# Patient Record
Sex: Male | Born: 1935 | Race: White | Hispanic: No | Marital: Married | Smoking: Never smoker
Health system: Southern US, Community
[De-identification: ages and names within clinical notes are randomized; demographics above are authoritative.]

## PROBLEM LIST (undated history)

## (undated) DIAGNOSIS — K5792 Diverticulitis of intestine, part unspecified, without perforation or abscess without bleeding: Secondary | ICD-10-CM

## (undated) DIAGNOSIS — Z972 Presence of dental prosthetic device (complete) (partial): Secondary | ICD-10-CM

## (undated) DIAGNOSIS — D649 Anemia, unspecified: Secondary | ICD-10-CM

## (undated) DIAGNOSIS — Z9289 Personal history of other medical treatment: Secondary | ICD-10-CM

## (undated) DIAGNOSIS — Z87442 Personal history of urinary calculi: Secondary | ICD-10-CM

## (undated) DIAGNOSIS — N429 Disorder of prostate, unspecified: Secondary | ICD-10-CM

## (undated) DIAGNOSIS — H9190 Unspecified hearing loss, unspecified ear: Secondary | ICD-10-CM

## (undated) DIAGNOSIS — N200 Calculus of kidney: Secondary | ICD-10-CM

## (undated) DIAGNOSIS — E119 Type 2 diabetes mellitus without complications: Secondary | ICD-10-CM

## (undated) DIAGNOSIS — R011 Cardiac murmur, unspecified: Secondary | ICD-10-CM

## (undated) DIAGNOSIS — C801 Malignant (primary) neoplasm, unspecified: Secondary | ICD-10-CM

## (undated) DIAGNOSIS — I10 Essential (primary) hypertension: Secondary | ICD-10-CM

## (undated) DIAGNOSIS — R001 Bradycardia, unspecified: Secondary | ICD-10-CM

## (undated) HISTORY — PX: COLONOSCOPY: SHX174

## (undated) HISTORY — PX: HERNIA REPAIR: SHX51

## (undated) HISTORY — PX: HEMORROIDECTOMY: SUR656

## (undated) HISTORY — PX: PILONIDAL CYST EXCISION: SHX744

## (undated) HISTORY — PX: PROSTATE SURGERY: SHX751

## (undated) HISTORY — PX: OTHER SURGICAL HISTORY: SHX169

---

## 2005-06-25 ENCOUNTER — Ambulatory Visit: Payer: Self-pay | Admitting: Unknown Physician Specialty

## 2009-11-12 ENCOUNTER — Encounter: Admission: RE | Admit: 2009-11-12 | Discharge: 2009-11-12 | Payer: Self-pay | Admitting: Unknown Physician Specialty

## 2009-11-14 ENCOUNTER — Encounter: Admission: RE | Admit: 2009-11-14 | Discharge: 2009-11-14 | Payer: Self-pay | Admitting: Unknown Physician Specialty

## 2010-07-31 ENCOUNTER — Ambulatory Visit: Payer: Self-pay | Admitting: Internal Medicine

## 2010-09-03 ENCOUNTER — Ambulatory Visit: Payer: Self-pay | Admitting: Unknown Physician Specialty

## 2010-09-05 ENCOUNTER — Inpatient Hospital Stay: Payer: Self-pay | Admitting: Internal Medicine

## 2011-10-06 DIAGNOSIS — C44310 Basal cell carcinoma of skin of unspecified parts of face: Secondary | ICD-10-CM | POA: Insufficient documentation

## 2012-02-09 ENCOUNTER — Ambulatory Visit: Payer: Self-pay | Admitting: Urology

## 2012-02-09 DIAGNOSIS — D419 Neoplasm of uncertain behavior of unspecified urinary organ: Secondary | ICD-10-CM | POA: Insufficient documentation

## 2012-02-09 DIAGNOSIS — N182 Chronic kidney disease, stage 2 (mild): Secondary | ICD-10-CM | POA: Insufficient documentation

## 2012-02-09 DIAGNOSIS — N200 Calculus of kidney: Secondary | ICD-10-CM | POA: Insufficient documentation

## 2012-05-20 ENCOUNTER — Ambulatory Visit: Payer: Self-pay

## 2012-05-29 ENCOUNTER — Ambulatory Visit: Payer: Self-pay

## 2012-05-31 DIAGNOSIS — N23 Unspecified renal colic: Secondary | ICD-10-CM | POA: Insufficient documentation

## 2012-06-26 ENCOUNTER — Ambulatory Visit: Payer: Self-pay

## 2012-07-27 ENCOUNTER — Ambulatory Visit: Payer: Self-pay

## 2012-12-08 ENCOUNTER — Ambulatory Visit: Payer: Self-pay | Admitting: Urology

## 2013-09-29 ENCOUNTER — Ambulatory Visit: Payer: Self-pay | Admitting: Unknown Physician Specialty

## 2013-09-30 LAB — PATHOLOGY REPORT

## 2013-11-05 ENCOUNTER — Emergency Department: Payer: Self-pay | Admitting: Emergency Medicine

## 2015-02-01 DIAGNOSIS — R339 Retention of urine, unspecified: Secondary | ICD-10-CM | POA: Insufficient documentation

## 2017-02-19 ENCOUNTER — Encounter
Admission: RE | Admit: 2017-02-19 | Discharge: 2017-02-19 | Disposition: A | Discharge status: Home / Self Care | Payer: Medicare Other | Source: Ambulatory Visit | Attending: Cardiology | Admitting: Cardiology

## 2017-02-19 ENCOUNTER — Ambulatory Visit
Admission: RE | Admit: 2017-02-19 | Discharge: 2017-02-19 | Disposition: A | Discharge status: Home / Self Care | Payer: Medicare Other | Source: Ambulatory Visit | Attending: Cardiology | Admitting: Cardiology

## 2017-02-19 DIAGNOSIS — Z01818 Encounter for other preprocedural examination: Secondary | ICD-10-CM | POA: Diagnosis present

## 2017-02-19 DIAGNOSIS — I10 Essential (primary) hypertension: Secondary | ICD-10-CM | POA: Insufficient documentation

## 2017-02-19 DIAGNOSIS — I517 Cardiomegaly: Secondary | ICD-10-CM | POA: Diagnosis not present

## 2017-02-19 DIAGNOSIS — R001 Bradycardia, unspecified: Secondary | ICD-10-CM | POA: Diagnosis not present

## 2017-02-19 DIAGNOSIS — E119 Type 2 diabetes mellitus without complications: Secondary | ICD-10-CM | POA: Diagnosis not present

## 2017-02-19 DIAGNOSIS — Z01812 Encounter for preprocedural laboratory examination: Secondary | ICD-10-CM | POA: Diagnosis present

## 2017-02-19 DIAGNOSIS — Z01810 Encounter for preprocedural cardiovascular examination: Secondary | ICD-10-CM | POA: Diagnosis present

## 2017-02-19 HISTORY — DX: Bradycardia, unspecified: R00.1

## 2017-02-19 HISTORY — DX: Type 2 diabetes mellitus without complications: E11.9

## 2017-02-19 HISTORY — DX: Essential (primary) hypertension: I10

## 2017-02-19 HISTORY — DX: Disorder of prostate, unspecified: N42.9

## 2017-02-19 HISTORY — DX: Calculus of kidney: N20.0

## 2017-02-19 LAB — CBC
HEMATOCRIT: 37.8 % — AB (ref 40.0–52.0)
HEMOGLOBIN: 12.6 g/dL — AB (ref 13.0–18.0)
MCH: 31.2 pg (ref 26.0–34.0)
MCHC: 33.3 g/dL (ref 32.0–36.0)
MCV: 93.8 fL (ref 80.0–100.0)
Platelets: 163 10*3/uL (ref 150–440)
RBC: 4.03 MIL/uL — AB (ref 4.40–5.90)
RDW: 13.3 % (ref 11.5–14.5)
WBC: 5.9 10*3/uL (ref 3.8–10.6)

## 2017-02-19 LAB — PROTIME-INR
INR: 1.06
Prothrombin Time: 13.7 seconds (ref 11.4–15.2)

## 2017-02-19 LAB — BASIC METABOLIC PANEL
ANION GAP: 9 (ref 5–15)
BUN: 21 mg/dL — AB (ref 6–20)
CO2: 25 mmol/L (ref 22–32)
Calcium: 9 mg/dL (ref 8.9–10.3)
Chloride: 105 mmol/L (ref 101–111)
Creatinine, Ser: 0.8 mg/dL (ref 0.61–1.24)
GFR calc Af Amer: >60 mL/min (ref >60)
GLUCOSE: 87 mg/dL (ref 65–99)
POTASSIUM: 4.6 mmol/L (ref 3.5–5.1)
Sodium: 139 mmol/L (ref 135–145)

## 2017-02-19 LAB — SURGICAL PCR SCREEN
MRSA, PCR: NEGATIVE
STAPHYLOCOCCUS AUREUS: NEGATIVE

## 2017-02-19 LAB — APTT: APTT: 31 s (ref 24–36)

## 2017-02-19 NOTE — Patient Instructions (Signed)
Your procedure is scheduled on: 02/25/17 Wed Report to Same Day Surgery 2nd floor medical mall [facility] Entrance-take elevator on left to 2nd floor.  Check in with surgery information desk.) To find out your arrival time please call [phone] between 1PM - 3PM on 02/24/17 Tues  Remember: Instructions that are not followed completely may result in serious medical risk, up to and including death, or upon the discretion of your surgeon and anesthesiologist your surgery may need to be rescheduled.    _x___ 1. Do not eat food after midnight the night before your procedure. You may drink clear liquids up to 2 hours before you are scheduled to arrive at the hospital for your procedure.  Do not drink clear liquids within 2 hours of your scheduled arrival to the hospital.  Clear liquids include  --Water or Apple juice without pulp  --Clear carbohydrate beverage such as ClearFast or Gatorade  --Black Coffee or Clear Tea (No milk, no creamers, do not add anything to                  the coffee or Tea Type 1 and type 2 diabetics should only drink water.  No gum chewing or hard candies.     __x__ 2. No Alcohol for 24 hours before or after surgery.   __x__3. No Smoking for 24 prior to surgery.   ____  4. Bring all medications with you on the day of surgery if instructed.    __x__ 5. Notify your doctor if there is any change in your medical condition     (cold, fever, infections).     Do not wear jewelry, make-up, hairpins, clips or nail polish.  Do not wear lotions, powders, or perfumes. You may wear deodorant.  Do not shave 48 hours prior to surgery. Men may shave face and neck.  Do not bring valuables to the hospital.    [facility] is not responsible for any belongings or valuables.               Contacts, dentures or bridgework may not be worn into surgery.  Leave your suitcase in the car. After surgery it may be brought to your room.  For patients admitted to the hospital,  discharge time is determined by your                       treatment team.   Patients discharged the day of surgery will not be allowed to drive home.  You will need someone to drive you home and stay with you the night of your procedure.    Please read over the following fact sheets that you were given:   [facility] Preparing for Surgery and or MRSA Information   _x___ Take anti-hypertensive listed below, cardiac, seizure, asthma,     anti-reflux and psychiatric medicines. These include:  1. None  2.  3.  4.  5.  6.  ____Fleets enema or Magnesium Citrate as directed.   _x___ Use CHG Soap or sage wipes as directed on instruction sheet   ____ Use inhalers on the day of surgery and bring to hospital day of surgery  ____ Stop Metformin and Janumet 2 days prior to surgery.    ____ Take 1/2 of usual insulin dose the night before surgery and none on the morning     surgery.   _x___ Follow recommendations from Cardiologist, Pulmonologist or PCP regarding  stopping Aspirin, Coumadin, Plavix ,Eliquis, Effient, or Pradaxa, and Pletal.  Stop Aspirin today if OK with MD.  X____Stop Anti-inflammatories such as Advil, Aleve, Ibuprofen, Motrin, Naproxen, Naprosyn, Goodies powders or aspirin products. OK to take Tylenol and                          Celebrex.   _x___ Stop supplements until after surgery.  But may continue Vitamin D, Vitamin B,       and multivitamin.   ____ Bring C-Pap to the hospital.

## 2017-02-24 MED ORDER — CEFAZOLIN SODIUM-DEXTROSE 1-4 GM/50ML-% IV SOLN
1.0000 g | Freq: Once | INTRAVENOUS | Status: AC
  Administered 2017-02-25: 1 g via INTRAVENOUS

## 2017-02-25 ENCOUNTER — Observation Stay: Payer: Medicare Other

## 2017-02-25 ENCOUNTER — Encounter
Admission: RE | Disposition: A | Discharge status: Home / Self Care | Payer: Self-pay | Source: Ambulatory Visit | Attending: Cardiology

## 2017-02-25 ENCOUNTER — Ambulatory Visit: Payer: Medicare Other | Admitting: Anesthesiology

## 2017-02-25 ENCOUNTER — Ambulatory Visit: Payer: Medicare Other

## 2017-02-25 ENCOUNTER — Encounter: Payer: Self-pay | Admitting: *Deleted

## 2017-02-25 ENCOUNTER — Observation Stay
Admission: RE | Admit: 2017-02-25 | Discharge: 2017-02-26 | Disposition: A | Discharge status: Home / Self Care | Payer: Medicare Other | Source: Ambulatory Visit | Attending: Cardiology | Admitting: Cardiology

## 2017-02-25 DIAGNOSIS — R001 Bradycardia, unspecified: Principal | ICD-10-CM | POA: Insufficient documentation

## 2017-02-25 DIAGNOSIS — E119 Type 2 diabetes mellitus without complications: Secondary | ICD-10-CM | POA: Diagnosis not present

## 2017-02-25 DIAGNOSIS — M1990 Unspecified osteoarthritis, unspecified site: Secondary | ICD-10-CM | POA: Diagnosis not present

## 2017-02-25 DIAGNOSIS — I509 Heart failure, unspecified: Secondary | ICD-10-CM | POA: Insufficient documentation

## 2017-02-25 DIAGNOSIS — Z7982 Long term (current) use of aspirin: Secondary | ICD-10-CM | POA: Insufficient documentation

## 2017-02-25 DIAGNOSIS — I495 Sick sinus syndrome: Secondary | ICD-10-CM | POA: Diagnosis present

## 2017-02-25 DIAGNOSIS — I493 Ventricular premature depolarization: Secondary | ICD-10-CM | POA: Insufficient documentation

## 2017-02-25 DIAGNOSIS — Z950 Presence of cardiac pacemaker: Secondary | ICD-10-CM

## 2017-02-25 DIAGNOSIS — I110 Hypertensive heart disease with heart failure: Secondary | ICD-10-CM | POA: Diagnosis not present

## 2017-02-25 HISTORY — DX: Presence of cardiac pacemaker: Z95.0

## 2017-02-25 HISTORY — PX: PACEMAKER INSERTION: SHX728

## 2017-02-25 SURGERY — INSERTION, CARDIAC PACEMAKER
Anesthesia: General | Site: Chest | Wound class: Clean

## 2017-02-25 MED ORDER — FAMOTIDINE 20 MG PO TABS
20.0000 mg | ORAL_TABLET | Freq: Once | ORAL | Status: AC
  Administered 2017-02-25: 20 mg via ORAL

## 2017-02-25 MED ORDER — ACETAMINOPHEN 325 MG PO TABS: 325.0000 mg | ORAL_TABLET | ORAL | Status: DC | PRN

## 2017-02-25 MED ORDER — LIDOCAINE HCL (PF) 2 % IJ SOLN
INTRAMUSCULAR | Status: AC
  Filled 2017-02-25: qty 10

## 2017-02-25 MED ORDER — LISINOPRIL 10 MG PO TABS
30.0000 mg | ORAL_TABLET | Freq: Every day | ORAL | Status: DC
  Administered 2017-02-25 – 2017-02-26 (×2): 30 mg via ORAL
  Filled 2017-02-25 (×2): qty 3

## 2017-02-25 MED ORDER — DEXTROSE 5 % IV SOLN
1000.0000 mg | Freq: Four times a day (QID) | INTRAVENOUS | Status: AC
  Administered 2017-02-25 – 2017-02-26 (×3): 1000 mg via INTRAVENOUS
  Filled 2017-02-25 (×3): qty 10

## 2017-02-25 MED ORDER — PROPOFOL 10 MG/ML IV BOLUS
INTRAVENOUS | Status: DC | PRN
  Administered 2017-02-25: 30 mg via INTRAVENOUS

## 2017-02-25 MED ORDER — ONDANSETRON HCL 4 MG/2ML IJ SOLN
INTRAMUSCULAR | Status: DC | PRN
  Administered 2017-02-25: 4 mg via INTRAVENOUS

## 2017-02-25 MED ORDER — LIDOCAINE 1 % OPTIME INJ - NO CHARGE
INTRAMUSCULAR | Status: DC | PRN
  Administered 2017-02-25: 30 mL

## 2017-02-25 MED ORDER — EPHEDRINE SULFATE 50 MG/ML IJ SOLN
INTRAMUSCULAR | Status: DC | PRN
  Administered 2017-02-25: 10 mg via INTRAVENOUS

## 2017-02-25 MED ORDER — FENTANYL CITRATE (PF) 100 MCG/2ML IJ SOLN
INTRAMUSCULAR | Status: DC | PRN
  Administered 2017-02-25: 25 ug via INTRAVENOUS

## 2017-02-25 MED ORDER — ONDANSETRON HCL 4 MG/2ML IJ SOLN
INTRAMUSCULAR | Status: AC
  Filled 2017-02-25: qty 2

## 2017-02-25 MED ORDER — GENTAMICIN SULFATE 40 MG/ML IJ SOLN
INTRAMUSCULAR | Status: AC
  Filled 2017-02-25: qty 2

## 2017-02-25 MED ORDER — SODIUM CHLORIDE 0.9 % IR SOLN: Freq: Once | Status: DC

## 2017-02-25 MED ORDER — PROPOFOL 500 MG/50ML IV EMUL
INTRAVENOUS | Status: AC
  Filled 2017-02-25: qty 50

## 2017-02-25 MED ORDER — SODIUM CHLORIDE 0.9 % IR SOLN
Status: DC | PRN
  Administered 2017-02-25: 200 mL

## 2017-02-25 MED ORDER — PROPOFOL 500 MG/50ML IV EMUL
INTRAVENOUS | Status: DC | PRN
  Administered 2017-02-25: 125 ug/kg/min via INTRAVENOUS

## 2017-02-25 MED ORDER — PROPOFOL 10 MG/ML IV BOLUS
INTRAVENOUS | Status: AC
  Filled 2017-02-25: qty 20

## 2017-02-25 MED ORDER — CEFAZOLIN SODIUM-DEXTROSE 1-4 GM/50ML-% IV SOLN
1.0000 g | Freq: Four times a day (QID) | INTRAVENOUS | Status: DC
  Filled 2017-02-25 (×2): qty 50

## 2017-02-25 MED ORDER — OFF THE BEAT BOOK: Freq: Once | Status: DC

## 2017-02-25 MED ORDER — LACTATED RINGERS IV SOLN
INTRAVENOUS | Status: DC
  Administered 2017-02-25: 12:00:00 via INTRAVENOUS

## 2017-02-25 MED ORDER — GLYCOPYRROLATE 0.2 MG/ML IJ SOLN
INTRAMUSCULAR | Status: DC | PRN
  Administered 2017-02-25: 0.2 mg via INTRAVENOUS

## 2017-02-25 MED ORDER — FENTANYL CITRATE (PF) 100 MCG/2ML IJ SOLN
INTRAMUSCULAR | Status: AC
  Filled 2017-02-25: qty 2

## 2017-02-25 MED ORDER — ONDANSETRON HCL 4 MG/2ML IJ SOLN: 4.0000 mg | Freq: Four times a day (QID) | INTRAMUSCULAR | Status: DC | PRN

## 2017-02-25 SURGICAL SUPPLY — 40 items
BAG DECANTER FOR FLEXI CONT (MISCELLANEOUS) ×3 IMPLANT
BRUSH SCRUB EZ  4% CHG (MISCELLANEOUS) ×2
BRUSH SCRUB EZ 4% CHG (MISCELLANEOUS) ×1 IMPLANT
CABLE SURG 12 DISP A/V CHANNEL (MISCELLANEOUS) ×3 IMPLANT
CANISTER SUCT 1200ML W/VALVE (MISCELLANEOUS) ×3 IMPLANT
CHLORAPREP W/TINT 26ML (MISCELLANEOUS) ×3 IMPLANT
CLOSURE WOUND 1/2 X4 (GAUZE/BANDAGES/DRESSINGS) ×1
COVER LIGHT HANDLE STERIS (MISCELLANEOUS) ×6 IMPLANT
COVER MAYO STAND STRL (DRAPES) ×3 IMPLANT
DRAPE C-ARM XRAY 36X54 (DRAPES) ×3 IMPLANT
DRSG TEGADERM 4X4.75 (GAUZE/BANDAGES/DRESSINGS) ×3 IMPLANT
DRSG TELFA 4X3 1S NADH ST (GAUZE/BANDAGES/DRESSINGS) ×3 IMPLANT
ELECT REM PT RETURN 9FT ADLT (ELECTROSURGICAL) ×3
ELECTRODE REM PT RTRN 9FT ADLT (ELECTROSURGICAL) ×1 IMPLANT
GLOVE BIO SURGEON STRL SZ7.5 (GLOVE) ×3 IMPLANT
GLOVE BIO SURGEON STRL SZ8 (GLOVE) ×3 IMPLANT
GOWN STRL REUS W/ TWL LRG LVL3 (GOWN DISPOSABLE) ×1 IMPLANT
GOWN STRL REUS W/ TWL XL LVL3 (GOWN DISPOSABLE) ×1 IMPLANT
GOWN STRL REUS W/TWL LRG LVL3 (GOWN DISPOSABLE) ×2
GOWN STRL REUS W/TWL XL LVL3 (GOWN DISPOSABLE) ×2
IMMOBILIZER SHDR MD LX WHT (SOFTGOODS) IMPLANT
IMMOBILIZER SHDR XL LX WHT (SOFTGOODS) ×3 IMPLANT
INTRO PACEMAKR LEAD 9FR 13CM (INTRODUCER) ×3
INTRO PACEMKR SHEATH II 7FR (MISCELLANEOUS) ×3
INTRODUCER PACEMKR LD 9FR 13CM (INTRODUCER) ×1 IMPLANT
INTRODUCER PACEMKR SHTH II 7FR (MISCELLANEOUS) ×1 IMPLANT
IPG PACE AZUR XT DR MRI W1DR01 (Pacemaker) ×1 IMPLANT
IV NS 500ML (IV SOLUTION) ×2
IV NS 500ML BAXH (IV SOLUTION) ×1 IMPLANT
KIT RM TURNOVER STRD PROC AR (KITS) ×3 IMPLANT
LABEL OR SOLS (LABEL) ×3 IMPLANT
LEAD CAPSURE NOVUS 5076-52CM (Lead) ×3 IMPLANT
LEAD CAPSURE NOVUS 5076-58CM (Lead) ×3 IMPLANT
MARKER SKIN DUAL TIP RULER LAB (MISCELLANEOUS) ×3 IMPLANT
PACE AZURE XT DR MRI W1DR01 (Pacemaker) ×3 IMPLANT
PACK PACE INSERTION (MISCELLANEOUS) ×3 IMPLANT
PAD ONESTEP ZOLL R SERIES ADT (MISCELLANEOUS) ×3 IMPLANT
SAFESHEATHLL ×5 IMPLANT
STRIP CLOSURE SKIN 1/2X4 (GAUZE/BANDAGES/DRESSINGS) ×2 IMPLANT
SUT SILK 0 SH 30 (SUTURE) ×9 IMPLANT

## 2017-02-25 NOTE — Progress Notes (Signed)
Talked to Dr. Marcille Blanco about patient's pacemaker site  saturated with blood and was change twice this shift, MD at bedside order to change dressing with gauze and silk tape. RN will also page cardiology. RN will continue to monitor.

## 2017-02-25 NOTE — Anesthesia Post-op Follow-up Note (Signed)
Anesthesia QCDR form completed.        

## 2017-02-25 NOTE — Progress Notes (Signed)
Talked to Dr. Nehemiah Massed and notified about the pacemaker site being saturated with blood and change twice this shift, notify about the dressing change. No other concern at the moment. RN will continue to monitor.

## 2017-02-25 NOTE — Anesthesia Preprocedure Evaluation (Signed)
Anesthesia Evaluation  Patient identified by MRN, date of birth, ID band Patient awake    Reviewed: Allergy & Precautions, H&P , NPO status , Patient's Chart, lab work & pertinent test results  History of Anesthesia Complications Negative for: history of anesthetic complications  Airway Mallampati: III  TM Distance: >3 FB Neck ROM: full    Dental  (+) Chipped, Missing, Poor Dentition   Pulmonary neg pulmonary ROS, neg shortness of breath,           Cardiovascular Exercise Tolerance: Good hypertension, (-) angina(-) Past MI + dysrhythmias      Neuro/Psych negative neurological ROS  negative psych ROS   GI/Hepatic negative GI ROS, Neg liver ROS,   Endo/Other  diabetes, Type 2  Renal/GU Renal disease  negative genitourinary   Musculoskeletal   Abdominal   Peds  Hematology negative hematology ROS (+)   Anesthesia Other Findings Signs and symptoms suggestive of sleep apnea   Past Medical History: No date: Bradycardia No date: Diabetes mellitus without complication (HCC)     Comment:  diet controlled No date: Hypertension No date: Kidney stones No date: Prostate disorder  Past Surgical History: No date: HERNIA REPAIR No date: PILONIDAL CYST EXCISION No date: PROSTATE SURGERY  BMI    Body Mass Index:  25.07 kg/m      Reproductive/Obstetrics negative OB ROS                             Anesthesia Physical Anesthesia Plan  ASA: III  Anesthesia Plan: General   Post-op Pain Management:    Induction: Intravenous  PONV Risk Score and Plan: Propofol infusion  Airway Management Planned: Natural Airway and Nasal Cannula  Additional Equipment:   Intra-op Plan:   Post-operative Plan:   Informed Consent: I have reviewed the patients History and Physical, chart, labs and discussed the procedure including the risks, benefits and alternatives for the proposed anesthesia with the  patient or authorized representative who has indicated his/her understanding and acceptance.   Dental Advisory Given  Plan Discussed with: Anesthesiologist, CRNA and Surgeon  Anesthesia Plan Comments: (Patient consented for risks of anesthesia including but not limited to:  - adverse reactions to medications - risk of intubation if required - damage to teeth, lips or other oral mucosa - sore throat or hoarseness - Damage to heart, brain, lungs or loss of life  Patient voiced understanding.)        Anesthesia Quick Evaluation

## 2017-02-25 NOTE — Transfer of Care (Signed)
Immediate Anesthesia Transfer of Care Note  Patient: Melvin Ferguson  Procedure(s) Performed: INSERTION PACEMAKER (N/A Chest)  Patient Location: PACU  Anesthesia Type:General  Level of Consciousness: awake, alert , oriented and patient cooperative  Airway & Oxygen Therapy: Patient Spontanous Breathing and Patient connected to nasal cannula oxygen  Post-op Assessment: Report given to RN and Post -op Vital signs reviewed and stable  Post vital signs: Reviewed and stable  Last Vitals:  Vitals:   02/25/17 1352 02/25/17 1355  BP: 140/67   Pulse: 60 (!) 58  Resp: 14 16  Temp: (!) 36.2 C   SpO2: 95% 96%    Last Pain:  Vitals:   02/25/17 1352  TempSrc: Temporal  PainSc: 0-No pain         Complications: No apparent anesthesia complications

## 2017-02-25 NOTE — Interval H&P Note (Signed)
History and Physical Interval Note:  02/25/2017 12:26 PM  Melvin Ferguson  has presented today for surgery, with the diagnosis of BRADYCARDIA  The various methods of treatment have been discussed with the patient and family. After consideration of risks, benefits and other options for treatment, the patient has consented to  Procedure(s): INSERTION PACEMAKER (N/A) as a surgical intervention .  The patient's history has been reviewed, patient examined, no change in status, stable for surgery.  I have reviewed the patient's chart and labs.  Questions were answered to the patient's satisfaction.     Javontay Vandam [employer]

## 2017-02-25 NOTE — Plan of Care (Signed)
Problem: Safety: Goal: Ability to remain free from injury will improve Outcome: Progressing  Patient's VSS throughout the shift. Patient denies pain. Pacemaker site had episode of dressing being saturated, change with pressure dressing. Patient rested well. RN will continue to monitor.

## 2017-02-25 NOTE — Op Note (Signed)
[facility]   02/25/2017                     1:48 PM  PATIENT:  Melvin Ferguson    PRE-OPERATIVE DIAGNOSIS:  BRADYCARDIA  POST-OPERATIVE DIAGNOSIS:  Same  PROCEDURE:  INSERTION PACEMAKER  SURGEON:  Isaias Cowman, MD    ANESTHESIA:     PREOPERATIVE INDICATIONS:  [name] is a  83 y.o. male with a diagnosis of BRADYCARDIA who failed conservative measures and elected for surgical management.    The risks benefits and alternatives were discussed with the patient preoperatively including but not limited to the risks of infection, bleeding, cardiopulmonary complications, the need for revision surgery, among others, and the patient was willing to proceed.   OPERATIVE PROCEDURE: The patient was brought to the operating room in fasting state.  The left pectoral region was prepped and draped in the usual sterile manner.  Anesthesia was obtained 1% lidocaine locally.  A 6 cm incision was performed in the left pectoral region.  Pacemaker pocket was generated by electrocautery and blunt dissection.  Access was obtained to the left subclavian vein by fine-needle aspiration.  MRI compatible leads were positioned into the right ventricular apical septum ( Medtronic [id number] ) and right atrial appendage ( Medtronic [id number] ) under fluoroscopic guidance.  After proper thresholds were obtained the leads were sutured in place.  Leads were connected to a MRI compatible dual-chamber rate responsive pacemaker generator ( Medtronic [id number] ).  The pacemaker pocket was irrigated with gentamicin solution.  The pacemaker generator was positioned into the pocket and the pocket was closed with 2-0 and 4-0 Vicryl, respectively.  Steri-Strips and a pressure dressing were applied.  Postprocedural interrogation revealed appropriate sensing and pacing thresholds..  There were no periprocedural complications.

## 2017-02-25 NOTE — H&P (Signed)
<6>29299-5<7>Encounter Details<6>46240-8<7>Social History<6>29762-2<7>Last Filed Vital Signs<6>8716-3<7>Progress Notes<6>10164-2<7>Plan of Treatment<6>18776-5<7>Procedures<6>47519-4<7>Lab Results<6>30954-2<7>Visit Diagnoses<6>51848-0<7>/"> Jump to Section ? Document InformationEncounter DetailsLab ResultsLast Filed Vital SignsPatient DemographicsPlan of TreatmentProceduresProgress NotesReason for VisitSocial HistoryVisit Diagnoses Melvin Ferguson, generated on Oct. 31, 2018 Printout Information  Document Contents Office Visit Document Received Date Oct. 31, 2018 [place]   Patient Demographics - 83 y.o. Male, born July 13, 1931   Patient Address Communication Language Race / Ethnicity  [address] [place], [place] [address] [phone] [facility]) [phone] (Mobile) [email or web address] English (Preferred) White / Not Hispanic or Latino  Reason for Visit    Reason Comments  Follow-up feeling about the same as before    Encounter Details    Date Type Department Care Team Description  02/13/2017 Office Visit [facility]  [place], [place] [address]  [phone]  Sydnee Levans, [place] [address] [place]  [place], [place] [address]  [phone]  [phone] (Fax)  Preop testing (Primary Dx);  Bradycardia;  Essential hypertension   Social History - as of this encounter   Tobacco Use Types Packs/Day Years Used Date  Never Smoker      Smokeless Tobacco: Never Used      Alcohol Use Drinks/Week oz/Week Comments  No      Sex Assigned at [employer] Date Recorded  Not on file    Job Start Date Occupation Industry  Not on file Not on file Not on file   Travel History Travel Start Travel End  No recent travel history available.     Last Filed Vital Signs - in this encounter   Vital Sign Reading Time  Taken  Blood Pressure 118/62 02/13/2017 2:15 PM EDT  Pulse 49 02/13/2017 2:15 PM EDT  Temperature - -  Respiratory Rate - -  Oxygen Saturation 98% 02/13/2017 2:15 PM EDT  Inhaled Oxygen Concentration - -  Weight 87.1 kg (192 lb) 02/13/2017 2:15 PM EDT  Height 182.9 cm (6') 02/13/2017 2:15 PM EDT  Body Mass Index 26.04 02/13/2017 2:15 PM EDT   Progress Notes - in this encounter   Sydnee Levans, MD - 02/13/2017 2:00 PM EDT Formatting of this note might be different from the original.   Chief Complaint: Chief Complaint  Patient presents with  . Follow-up  feeling about the same as before  Date of Service: 02/13/2017 Date of Birth: Sep 18, 1931 PCP: Angelena Form, MD  History of Present Illness: Mr. [name] is a 83 y.o.male patient who presents for evaluation. Was recently noted to have an abnormal electrocardiogram showing sinus bradycardia. He also has a history of hypertension. He has some generalized fatigue but denies syncope or presyncope. EKG has shown sinus bradycardia with intermittent episodes of PVCs with fusion beats as well as ventricular bigeminy. He has PVCs with fusion beats. He denies any over-the-counter medications or illicit drug use. He gets lightheaded but has never had syncope. He does have some daytime somnolence. Echocardiogram showed preserved LV function. Holter monitor revealed evidence of an average heart rate of 55 with a minimum rate of 22 and a maximum rate of 93. He had frequent pauses of greater than 2 seconds longest of which was 2.9 seconds. Many of the pauses were at night during sleeping hours however he has some during daytime hours with near syncope. Overnight pulse ox is pending however patient will need dual-chamber permanent pacemaker due to symptomatic bradycardia and he is on no AV nodal meds.  Past Medical and Surgical History  Past Medical History Past Medical History:  Diagnosis Date  . Anemia, unspecified  . Arthritis  .  Diverticulosis  . History of chicken pox  . Hyperglycemia, unspecified  . Hypertension  . Kidney stones  Follows with Dr Jacqlyn Larsen  . Lung nodule   Past Surgical History He has a past surgical history that includes Colonoscopy (12/27/1997); Colonoscopy (06/15/1998); Colonoscopy (10/21/2000); Colonoscopy (06/25/2005); Colonoscopy (09/03/2010); Colonoscopy (09/29/2013); TURP vaporization; Mohs surgery; and Hernia repair.   Medications and Allergies  Current Medications  Current Outpatient Medications  Medication Sig Dispense Refill  . aspirin 81 MG EC tablet Take 81 mg by mouth once daily.  . blood glucose diagnostic (ONETOUCH ULTRA TEST) test strip Use once daily. Test 1 time daily 100 each 5  . calcium citrate-vitamin D3 (CITRACAL+D) 315-200 mg-unit tablet Take 2 tablets by mouth 2 (two) times daily with meals.  Marland Kitchen lisinopril (PRINIVIL,ZESTRIL) 30 MG tablet Take 1 tablet (30 mg total) by mouth once daily. 30 tablet 5  . magnesium 250 mg Tab Take 1 tablet by mouth once daily.  . multivitamin tablet Take 1 tablet by mouth once daily.   No current facility-administered medications for this visit.   Allergies: Patient has no known allergies.  Social and Family History  Social History reports that he has never smoked. He has never used smokeless tobacco. He reports that he does not drink alcohol or use drugs.  Family History Family History  Problem Relation Age of Onset  . Colon cancer Mother  . Parkinsonism Father   Review of Systems  Review of Systems  Constitutional: Positive for malaise/fatigue. Negative for chills, diaphoresis, fever and weight loss.  HENT: Negative for congestion, ear discharge, hearing loss and tinnitus.  Eyes: Negative for blurred vision.  Respiratory: Negative for cough, hemoptysis, sputum production, shortness of breath and wheezing.  Cardiovascular: Negative for chest pain, palpitations, orthopnea, claudication, leg swelling and PND.  Gastrointestinal:  Negative for abdominal pain, blood in stool, constipation, diarrhea, heartburn, melena, nausea and vomiting.  Genitourinary: Negative for dysuria, frequency, hematuria and urgency.  Musculoskeletal: Negative for back pain, falls, joint pain and myalgias.  Skin: Negative for itching and rash.  Neurological: Positive for dizziness. Negative for tingling, focal weakness, loss of consciousness, weakness and headaches.  Endo/Heme/Allergies: Negative for polydipsia. Does not bruise/bleed easily.  Psychiatric/Behavioral: Negative for depression, memory loss and substance abuse. The patient is not nervous/anxious.   Physical Examination   Vitals:BP 118/62 (BP Location: Left upper arm, Patient Position: Sitting, BP Cuff Size: Adult)  Pulse (!) 49  Ht 182.9 cm (6')  Wt 87.1 kg (192 lb)  SpO2 98%  BMI 26.04 kg/m  Ht:182.9 cm (6') Wt:87.1 kg (192 lb) TGG:YIRS surface area is 2.1 meters squared. Body mass index is 26.04 kg/m.  Wt Readings from Last 3 Encounters:  02/13/17 87.1 kg (192 lb)  01/29/17 86.6 kg (191 lb)  01/02/17 84.8 kg (187 lb)   BP Readings from Last 3 Encounters:  02/13/17 118/62  01/29/17 118/68  01/02/17 140/70   General appearance appears in no acute distress  Head Mouth and Eye exam Normocephalic, without obvious abnormality, atraumatic Dentition is good Eyes appear anicteric   Neck exam Thyroid: normal  Nodes: no obvious adenopathy  LUNGS Breath Sounds: Normal Percussion: Normal  CARDIOVASCULAR JVP CV wave: no HJR: no Elevation at 90 degrees: None Carotid Pulse: normal pulsation bilaterally Bruit: None Apex: apical impulse normal  Auscultation Rhythm: sinus bradycardia, rate=49 S1: normal S2: normal Clicks:  no Rub: no Murmurs: no murmurs  Gallop: None ABDOMEN Liver enlargement: no Pulsatile aorta: no Ascites: no Bruits: no  EXTREMITIES Clubbing: no Edema: trace to 1+ bilateral pedal edema Pulses: peripheral pulses  symmetrical Femoral Bruits: no Amputation: no SKIN Rash: no Cyanosis: no Embolic phemonenon: no Bruising: no NEURO Alert and Oriented to person, place and time: yes Non focal: yes  PSYCH: Pt appears to have normal affect  LABS REVIEWED Last 3 CBC results: Lab Results  Component Value Date  WBC 5.3 06/17/2016  WBC 5.5 06/13/2015  WBC 5.2 12/05/2014   Lab Results  Component Value Date  HGB 12.6 (L) 06/17/2016  HGB 12.7 (L) 06/13/2015  HGB 12.3 (L) 12/05/2014   Lab Results  Component Value Date  HCT 37.5 (L) 06/17/2016  HCT 38.5 (L) 06/13/2015  HCT 37.4 (L) 12/05/2014   Lab Results  Component Value Date  PLT 156 06/17/2016  PLT 174 06/13/2015  PLT 156 12/05/2014   Lab Results  Component Value Date  CREATININE 0.9 12/17/2016  BUN 22 12/17/2016  NA 142 12/17/2016  K 4.7 12/17/2016  CL 108 12/17/2016  CO2 28.5 12/17/2016   Lab Results  Component Value Date  HGBA1C 6.0 (H) 12/17/2016   Lab Results  Component Value Date  HDL 50.8 06/17/2016  HDL 47.4 06/13/2015  HDL 40.1 05/30/2014   Lab Results  Component Value Date  LDLCALC 100 06/17/2016  LDLCALC 107 06/13/2015  LDLCALC 121 05/30/2014   Lab Results  Component Value Date  TRIG 103 06/17/2016  TRIG 101 06/13/2015  TRIG 120 05/30/2014   Lab Results  Component Value Date  ALT 24 12/17/2016  AST 23 12/17/2016  ALKPHOS 57 12/17/2016   No results found for: TSH  Diagnostic Studies Reviewed:  EKG EKG demonstrated nonspecific ST and T waves changes, sinus bradycardia, frequent PVC's noted.  Assessment and Plan   83 y.o. male with  ICD-10-CM ICD-9-CM  1. Bradycardia-etiology of bradycardia is unclear. May be related to his premature ventricular contractions. Holter showed some pausing especially at night. His bradycardia that he is noted to have appears to be largely due to PVCs but also does have pauses of up to 2.9 seconds. Will need dual-chamber permanent pacemaker. Risk and benefits of this  procedure were explained to the patient and his wife. No driving until device is placed. Further recommendations after pacemaker. R00.1 427.89     2. Essential hypertension-continue with lisinopril. Avoid rate related medications. DASH diet recommended. I10 401.9  3. SOB (shortness of breath)-will continue to follow. Echo showed no valvular abnormalities and normal LV function. R06.02 786.05   Return in about 3 weeks (around 03/06/2017).  These notes generated with voice recognition software. I apologize for typographical errors.  Sydnee Levans, MD      Plan of Treatment - as of this encounter   Upcoming Encounters Upcoming Encounters  Date Type Specialty Care Team Description  06/19/2017 Ancillary Orders Lab Angelena Form, MD  [place], [place] [address]  [phone]  [phone] (Fax)    06/26/2017 Office Visit  Angelena Form, MD  [place], [place] [address]  [phone]  [phone] (Fax)     Procedures - in this encounter   Procedure Name Priority Date/Time Associated Diagnosis Comments  CBC W/AUTO DIFFERENTIAL (5 PART DIFF) -[facility], [facility] Routine 02/13/2017 2:53 PM EDT Preop testing  Results for this procedure are in the results section.   BASIC METABOLIC PANEL (BMP) - [facility] A[facility], Jefm Bryant  Routine 02/13/2017 2:53 PM EDT Preop testing  Results for this procedure are in the results section.    Lab Results - in this encounter   Table of Contents for Lab Results CBC w/auto Differential (5 Part) (02/13/2017 2:53 PM EDT) Basic Metabolic Panel (BMP) (03/50/0938 2:53 PM EDT)    CBC w/auto Differential (5 Part) (02/13/2017 2:53 PM EDT) CBC w/auto Differential (5 Part) (02/13/2017 2:53 PM EDT)  Component Value Ref Range Performed At Pathologist Signature  WBC (White Blood Cell Count) 5.1 4.1 - 10.2 10^3/uL [facility] - [facility]   RBC (Red Blood Cell Count) 3.70 (L)  4.69 - 6.13 10^6/uL [facility]   Hemoglobin 11.8 (L) 14.1 - 18.1 gm/dL [facility]   Hematocrit 36.3 (L) 40.0 - 52.0 % [facility]   MCV (Mean Corpuscular Volume) 98.1 80.0 - 100.0 fl [facility]   MCH (Mean Corpuscular Hemoglobin) 31.9 (H) 27.0 - 31.2 pg [facility]   MCHC (Mean Corpuscular Hemoglobin Concentration) 32.5 32.0 - 36.0 gm/dL [facility]   Platelet Count 142 (L) 150 - 450 10^3/uL [place] [facility]   RDW-CV (Red Cell Distribution Width) 13.1 11.6 - 14.8 % [facility]   MPV (Mean Platelet Volume) 9.6 9.4 - 12.4 fl [facility] - [facility]   Neutrophils 2.73 1.50 - 7.80 10^3/uL [place] - [facility]   Lymphocytes 1.37 1.00 - 3.60 10^3/uL [place] [facility] - [facility]   Monocytes 0.82 0.00 - 1.50 10^3/uL [facility]   Eosinophils 0.15 0.00 - 0.55 10^3/uL [facility]   Basophils 0.04 0.00 - 0.09 10^3/uL [facility]   Neutrophil % 53.3 32.0 - 70.0 % [facility]   Lymphocyte % 26.8 10.0 - 50.0 % [facility]   Monocyte % 16.0 (H) 4.0 - 13.0 % [facility]   Eosinophil % 2.9 1.0 - 5.0 % [facility]   Basophil% 0.8 0.0 - 2.0 % [facility]   Immature Granulocyte % 0.2 <=0.7 % [facility]   Immature Granulocyte Count 0.01 <=0.06 10^3/L [facility]    CBC w/auto Differential (5 Part) (02/13/2017 2:53 PM EDT)  Specimen  Blood   CBC w/auto Differential (5 Part) (02/13/2017 2:53 PM EDT)  Performing Organization Address City/State/Zipcode Phone Number  [facility] - [facility]  [place], [place] [address]    Back to top of Lab Results   Basic Metabolic Panel (BMP) (96/78/9381 2:53 PM EDT) Basic Metabolic Panel (BMP) (01/75/1025 2:53 PM EDT)  Component Value Ref Range  Performed At Pathologist Signature  Glucose 89 70 - 110 mg/dL [facility]   Sodium 141 136 - 145 mmol/L [facility]   Potassium 4.8 3.6 - 5.1 mmol/L [facility]   Chloride 107 97 - 109 mmol/L [facility]   Carbon Dioxide (CO2) 29.0 22.0 - 32.0 mmol/L [facility]   Calcium 9.1 8.7 - 10.3 mg/dL [facility]   Urea Nitrogen (BUN) 19 7 - 25 mg/dL [facility]   Creatinine 0.8 0.7 - 1.3 mg/dL [facility]   Glomerular Filtration  Rate (eGFR), MDRD Estimate 92 >60 mL/min/1.73sq m [facility]   BUN/Crea Ratio 23.8 (H) 6.0 - 20.0 [facility]   Anion Gap w/K 9.8 6.0 - 16.0 [place] - [facility]    Basic Metabolic Panel (BMP) (52/58/9483 2:53 PM EDT)  Specimen  Blood   Basic Metabolic Panel (BMP) (47/58/3074 2:53 PM EDT)  Performing Organization Address City/State/Zipcode Phone Number  [place]  [address], [place] [address]    Back to top of Lab Results   Visit Diagnoses - in this encounter   Diagnosis  Preop testing - Primary  Unspecified pre-operative examination   Bradycardia  Other specified cardiac dysrhythmias   Essential hypertension    Images Document Information   Primary Care Provider Angelena Form MD (Apr. 25, 2015 - Present) [phone] (Work) [phone] (Fax) [address] [place], [place] [address]  Document Coverage Dates Oct. 19, 2018  [facility] [address] [place], [place] [address]   Encounter Providers Sydnee Levans MD (Attending) [phone] (Work) [phone] (Fax) [place], [place] [address]   Encounter Date Oct. 19, 2018   Show All Sections

## 2017-02-25 NOTE — Anesthesia Postprocedure Evaluation (Signed)
Anesthesia Post Note  Patient: Melvin Ferguson  Procedure(s) Performed: INSERTION PACEMAKER (N/A Chest)  Patient location during evaluation: PACU Anesthesia Type: General Level of consciousness: awake and alert Pain management: pain level controlled Vital Signs Assessment: post-procedure vital signs reviewed and stable Respiratory status: spontaneous breathing, nonlabored ventilation, respiratory function stable and patient connected to nasal cannula oxygen Cardiovascular status: blood pressure returned to baseline and stable Postop Assessment: no apparent nausea or vomiting Anesthetic complications: no     Last Vitals:  Vitals:   02/25/17 1422 02/25/17 1435  BP: (!) 149/79 (!) 147/89  Pulse: 67 62  Resp: (!) 22 16  Temp:  36.7 C  SpO2: 94% 96%    Last Pain:  Vitals:   02/25/17 1435  TempSrc:   PainSc: 0-No pain                 Precious Haws Piscitello

## 2017-02-25 NOTE — Anesthesia Procedure Notes (Signed)
Date/Time: 02/25/2017 12:43 PM Performed by: Darlyne Russian Pre-anesthesia Checklist: Patient identified, Emergency Drugs available, Suction available, Patient being monitored and Timeout performed Patient Re-evaluated:Patient Re-evaluated prior to induction Oxygen Delivery Method: Simple face mask Induction Type: IV induction Placement Confirmation: positive ETCO2

## 2017-02-26 DIAGNOSIS — R001 Bradycardia, unspecified: Secondary | ICD-10-CM | POA: Diagnosis not present

## 2017-02-26 MED ORDER — CEPHALEXIN 250 MG PO CAPS
250.0000 mg | ORAL_CAPSULE | Freq: Four times a day (QID) | ORAL | 0 refills | Status: AC
Start: 2017-02-26 — End: 2017-03-05

## 2017-02-26 NOTE — Progress Notes (Signed)
IV and tele removed from patient. Discharge instructions given to patient along with hard copy prescriptions. Verbalized understanding. No distress at this time. Wife is at bedside and will transport patient home.

## 2017-02-26 NOTE — Discharge Summary (Signed)
    Physician Discharge Summary  Patient ID: Melvin Ferguson MRN: [id number] DOB/AGE: 1931-10-10 83 y.o.  Admit date: 02/25/2017 Discharge date: 02/26/2017  Primary Discharge Diagnosis Bradycardia Secondary Discharge Diagnosis Bradycardia  Significant Diagnostic Studies: yes  Consults: cardiology  Hospital Course: 83 year old male with a diagnosis of bradycardia who failed conservative measures and elected for surgical management.  The patient successfully underwent dual-chamber pacemaker implantation on 02/25/17 performed by Dr. Saralyn Pilar without perioperative complications.  Yesterday evening, the gauze covering the incision site was saturated with blood. This morning, the gauze was removed.  A medium sized clot had formed under the Steri-Strips.  The Steri-Strips were removed, the site cleaned with iodine, and under sterile technique, the strips were reapplied securely; gauze and Tegaderm were applied to the site.  No active bleeding continued.  There was no erythema, warmth or significant tenderness with palpation to the incision site.      Discharge Exam: Blood pressure 135/60, pulse (!) 45, temperature 98.5 F (36.9 C), temperature source Oral, resp. rate 19, height 6\' 1"  (1.854 m), weight 86.2 kg (190 lb), SpO2 95 %.  Heart rate is 60 bpm.  General appearance: alert, cooperative, appears stated age and no distress Head: Normocephalic, without obvious abnormality, atraumatic Resp: Normal effort of breathing, no accessory rest Cardio: regular rate and rhythm, S1, S2 normal, no murmur, click, rub or gallop Extremities: extremities normal, atraumatic, no cyanosis or edema Skin: Skin color, texture, turgor normal. No rashes or lesions Incision/Wound: Psych: Good affect, responds appropriately  Incision/Wound: Left upper chest: Overlying gauze initially saturated with blood, steri strips intact but saturated with blood. Medium sized blood clot formation under steri strips.  Reapplied steri strips using sterile technique. No continued active bleeding. No surrounding, erythema or warmth or pain to palpation.  Labs:   Lab Results  Component Value Date   WBC 5.9 02/19/2017   HGB 12.6 (L) 02/19/2017   HCT 37.8 (L) 02/19/2017   MCV 93.8 02/19/2017   PLT 163 02/19/2017    Recent Labs Lab 02/19/17 1415  NA 139  K 4.6  CL 105  CO2 25  BUN 21*  CREATININE 0.80  CALCIUM 9.0  GLUCOSE 87      Radiology: Chest x-ray negative for pneumothorax or pleural effusion. EKG: AV dual paced rhythm at a rate of 67 bpm with occasional PVCs The PVCs are likely the cause of the erroneous low pulse documented this morning.  Telemetry shows rate of 60 bpm currently  FOLLOW UP PLANS AND APPOINTMENTS  Allergies as of 02/26/2017   No Known Allergies     Medication List    TAKE these medications   aspirin EC 81 MG tablet Take 81 mg by mouth daily.   calcium citrate-vitamin D 315-200 MG-UNIT tablet Commonly known as:  CITRACAL+D Take 1 tablet by mouth 2 (two) times daily.   cephALEXin 250 MG capsule Commonly known as:  KEFLEX Take 1 capsule (250 mg total) by mouth 4 (four) times daily.   lisinopril 30 MG tablet Commonly known as:  PRINIVIL,ZESTRIL Take 1 tablet by mouth daily.   Magnesium 250 MG Tabs Take 1 tablet by mouth daily.   multivitamin with minerals tablet Take 1 tablet by mouth daily.        BRING ALL MEDICATIONS WITH YOU TO FOLLOW UP APPOINTMENTS  Time spent with patient to include physician time: 25 minutes Signed:  Clabe Seal PA-C 02/26/2017, 9:03 AM

## 2017-12-29 DIAGNOSIS — Z0000 Encounter for general adult medical examination without abnormal findings: Secondary | ICD-10-CM | POA: Insufficient documentation

## 2017-12-29 DIAGNOSIS — Z950 Presence of cardiac pacemaker: Secondary | ICD-10-CM | POA: Insufficient documentation

## 2018-06-30 DIAGNOSIS — I519 Heart disease, unspecified: Secondary | ICD-10-CM | POA: Insufficient documentation

## 2019-05-21 ENCOUNTER — Other Ambulatory Visit: Payer: Self-pay

## 2019-05-21 ENCOUNTER — Ambulatory Visit: Payer: Medicare Other | Attending: Internal Medicine

## 2019-05-21 DIAGNOSIS — Z23 Encounter for immunization: Secondary | ICD-10-CM | POA: Insufficient documentation

## 2019-05-21 NOTE — Progress Notes (Signed)
   Covid-19 Vaccination Clinic  Name:  Melvin Ferguson    MRN: [id number] DOB: [birth year]  05/21/2019  [name] was observed post Covid-19 immunization for 15 minutes without incidence. He was provided with Vaccine Information Sheet and instruction to access the V-Safe system.   [name] was instructed to call 911 with any severe reactions post vaccine: Marland Kitchen Difficulty breathing  . Swelling of your face and throat  . A fast heartbeat  . A bad rash all over your body  . Dizziness and weakness    Immunizations Administered    Name Date Dose VIS Date Route   Pfizer COVID-19 Vaccine 05/21/2019  2:50 PM 0.3 mL 04/08/2019 Intramuscular   Manufacturer: [place]   Lot: [id number]   [place]: [id number]

## 2019-06-12 ENCOUNTER — Ambulatory Visit: Payer: Medicare Other | Attending: Internal Medicine

## 2019-06-12 ENCOUNTER — Ambulatory Visit: Payer: Medicare Other

## 2019-06-12 NOTE — Progress Notes (Signed)
   Covid-19 Vaccination Clinic  Name:  Melvin Ferguson    MRN: [id number] DOB: March 25, 1932  06/12/2019  [name] was observed post Covid-19 immunization for 15 minutes without incidence. He was provided with Vaccine Information Sheet and instruction to access the V-Safe system.   [name] was instructed to call 911 with any severe reactions post vaccine: Marland Kitchen Difficulty breathing  . Swelling of your face and throat  . A fast heartbeat  . A bad rash all over your body  . Dizziness and weakness

## 2019-06-30 DIAGNOSIS — Z87442 Personal history of urinary calculi: Secondary | ICD-10-CM | POA: Insufficient documentation

## 2019-08-03 ENCOUNTER — Ambulatory Visit: Payer: Self-pay | Admitting: Urology

## 2019-08-04 ENCOUNTER — Other Ambulatory Visit: Payer: Self-pay

## 2019-08-04 ENCOUNTER — Encounter: Payer: Self-pay | Admitting: Ophthalmology

## 2019-08-08 ENCOUNTER — Other Ambulatory Visit: Payer: Self-pay

## 2019-08-08 ENCOUNTER — Other Ambulatory Visit
Admission: RE | Admit: 2019-08-08 | Discharge: 2019-08-08 | Disposition: A | Discharge status: Home / Self Care | Payer: Medicare Other | Source: Ambulatory Visit | Attending: Ophthalmology | Admitting: Ophthalmology

## 2019-08-08 DIAGNOSIS — Z20822 Contact with and (suspected) exposure to covid-19: Secondary | ICD-10-CM | POA: Insufficient documentation

## 2019-08-08 DIAGNOSIS — Z01812 Encounter for preprocedural laboratory examination: Secondary | ICD-10-CM | POA: Insufficient documentation

## 2019-08-08 LAB — SARS CORONAVIRUS 2 (TAT 6-24 HRS): SARS Coronavirus 2: NEGATIVE

## 2019-08-08 NOTE — Discharge Instructions (Signed)

## 2019-08-10 ENCOUNTER — Encounter
Admission: RE | Disposition: A | Discharge status: Home / Self Care | Payer: Self-pay | Source: Home / Self Care | Attending: Ophthalmology

## 2019-08-10 ENCOUNTER — Encounter: Payer: Self-pay | Admitting: Ophthalmology

## 2019-08-10 ENCOUNTER — Ambulatory Visit: Payer: Medicare Other | Admitting: Anesthesiology

## 2019-08-10 ENCOUNTER — Ambulatory Visit
Admission: RE | Admit: 2019-08-10 | Discharge: 2019-08-10 | Disposition: A | Discharge status: Home / Self Care | Payer: Medicare Other | Attending: Ophthalmology | Admitting: Ophthalmology

## 2019-08-10 ENCOUNTER — Other Ambulatory Visit: Payer: Self-pay

## 2019-08-10 DIAGNOSIS — E1136 Type 2 diabetes mellitus with diabetic cataract: Secondary | ICD-10-CM | POA: Diagnosis not present

## 2019-08-10 DIAGNOSIS — H2512 Age-related nuclear cataract, left eye: Secondary | ICD-10-CM | POA: Diagnosis present

## 2019-08-10 DIAGNOSIS — Z79899 Other long term (current) drug therapy: Secondary | ICD-10-CM | POA: Diagnosis not present

## 2019-08-10 DIAGNOSIS — I10 Essential (primary) hypertension: Secondary | ICD-10-CM | POA: Insufficient documentation

## 2019-08-10 DIAGNOSIS — Z950 Presence of cardiac pacemaker: Secondary | ICD-10-CM | POA: Insufficient documentation

## 2019-08-10 HISTORY — DX: Presence of dental prosthetic device (complete) (partial): Z97.2

## 2019-08-10 HISTORY — PX: CATARACT EXTRACTION W/PHACO: SHX586

## 2019-08-10 SURGERY — PHACOEMULSIFICATION, CATARACT, WITH IOL INSERTION
Anesthesia: Monitor Anesthesia Care | Site: Eye | Laterality: Left

## 2019-08-10 MED ORDER — BRIMONIDINE TARTRATE-TIMOLOL 0.2-0.5 % OP SOLN
OPHTHALMIC | Status: DC | PRN
  Administered 2019-08-10: 1 [drp] via OPHTHALMIC

## 2019-08-10 MED ORDER — CEFUROXIME OPHTHALMIC INJECTION 1 MG/0.1 ML
INJECTION | OPHTHALMIC | Status: DC | PRN
  Administered 2019-08-10: 0.1 mL via INTRACAMERAL

## 2019-08-10 MED ORDER — ACETAMINOPHEN 160 MG/5ML PO SOLN: 325.0000 mg | Freq: Once | ORAL | Status: DC

## 2019-08-10 MED ORDER — FENTANYL CITRATE (PF) 100 MCG/2ML IJ SOLN
INTRAMUSCULAR | Status: DC | PRN
  Administered 2019-08-10 (×2): 50 ug via INTRAVENOUS

## 2019-08-10 MED ORDER — ACETAMINOPHEN 325 MG PO TABS: 325.0000 mg | ORAL_TABLET | Freq: Once | ORAL | Status: DC

## 2019-08-10 MED ORDER — TETRACAINE HCL 0.5 % OP SOLN
1.0000 [drp] | OPHTHALMIC | Status: DC | PRN
  Administered 2019-08-10 (×3): 1 [drp] via OPHTHALMIC

## 2019-08-10 MED ORDER — LACTATED RINGERS IV SOLN: 10.0000 mL/h | INTRAVENOUS | Status: DC

## 2019-08-10 MED ORDER — MOXIFLOXACIN HCL 0.5 % OP SOLN
1.0000 [drp] | OPHTHALMIC | Status: DC | PRN
  Administered 2019-08-10 (×3): 1 [drp] via OPHTHALMIC

## 2019-08-10 MED ORDER — NA HYALUR & NA CHOND-NA HYALUR 0.4-0.35 ML IO KIT
PACK | INTRAOCULAR | Status: DC | PRN
  Administered 2019-08-10: 1 mL via INTRAOCULAR

## 2019-08-10 MED ORDER — LACTATED RINGERS IV SOLN: 100.0000 mL/h | INTRAVENOUS | Status: DC

## 2019-08-10 MED ORDER — EPINEPHRINE PF 1 MG/ML IJ SOLN
INTRAOCULAR | Status: DC | PRN
  Administered 2019-08-10: 74 mL via OPHTHALMIC

## 2019-08-10 MED ORDER — LIDOCAINE HCL (PF) 2 % IJ SOLN
INTRAOCULAR | Status: DC | PRN
  Administered 2019-08-10: 1 mL

## 2019-08-10 MED ORDER — ARMC OPHTHALMIC DILATING DROPS
1.0000 "application " | OPHTHALMIC | Status: DC | PRN
  Administered 2019-08-10 (×3): 1 via OPHTHALMIC

## 2019-08-10 SURGICAL SUPPLY — 22 items
CANNULA ANT/CHMB 27G (MISCELLANEOUS) ×1 IMPLANT
CANNULA ANT/CHMB 27GA (MISCELLANEOUS) ×3 IMPLANT
GLOVE SURG LX 7.5 STRW (GLOVE) ×2
GLOVE SURG LX STRL 7.5 STRW (GLOVE) ×1 IMPLANT
GLOVE SURG TRIUMPH 8.0 PF LTX (GLOVE) ×3 IMPLANT
GOWN STRL REUS W/ TWL LRG LVL3 (GOWN DISPOSABLE) ×2 IMPLANT
GOWN STRL REUS W/TWL LRG LVL3 (GOWN DISPOSABLE) ×4
LENS IOL TECNIS ITEC 16.0 (Intraocular Lens) ×2 IMPLANT
MARKER SKIN DUAL TIP RULER LAB (MISCELLANEOUS) ×3 IMPLANT
NDL CAPSULORHEX 25GA (NEEDLE) ×1 IMPLANT
NDL FILTER BLUNT 18X1 1/2 (NEEDLE) ×2 IMPLANT
NEEDLE CAPSULORHEX 25GA (NEEDLE) ×3 IMPLANT
NEEDLE FILTER BLUNT 18X 1/2SAF (NEEDLE) ×4
NEEDLE FILTER BLUNT 18X1 1/2 (NEEDLE) ×2 IMPLANT
PACK CATARACT BRASINGTON (MISCELLANEOUS) ×3 IMPLANT
PACK EYE AFTER SURG (MISCELLANEOUS) ×3 IMPLANT
PACK OPTHALMIC (MISCELLANEOUS) ×3 IMPLANT
SOLUTION OPHTHALMIC SALT (MISCELLANEOUS) ×3 IMPLANT
SYR 3ML LL SCALE MARK (SYRINGE) ×6 IMPLANT
SYR TB 1ML LUER SLIP (SYRINGE) ×3 IMPLANT
WATER STERILE IRR 250ML POUR (IV SOLUTION) ×3 IMPLANT
WIPE NON LINTING 3.25X3.25 (MISCELLANEOUS) ×3 IMPLANT

## 2019-08-10 NOTE — H&P (Signed)

## 2019-08-10 NOTE — Transfer of Care (Signed)
Immediate Anesthesia Transfer of Care Note  Patient: Claus Silvestro Coby  Procedure(s) Performed: CATARACT EXTRACTION PHACO AND INTRAOCULAR LENS PLACEMENT (IOC) LEFT DIABETIC 9.74  01:16.4  12.7%  (Left Eye)  Patient Location: PACU  Anesthesia Type: MAC  Level of Consciousness: awake, alert  and patient cooperative  Airway and Oxygen Therapy: Patient Spontanous Breathing and Patient connected to supplemental oxygen  Post-op Assessment: Post-op Vital signs reviewed, Patient's Cardiovascular Status Stable, Respiratory Function Stable, Patent Airway and No signs of Nausea or vomiting  Post-op Vital Signs: Reviewed and stable  Complications: No apparent anesthesia complications

## 2019-08-10 NOTE — Op Note (Signed)
OPERATIVE NOTE  CADE SWAYZER [id number] 08/10/2019   PREOPERATIVE DIAGNOSIS:  Nuclear sclerotic cataract left eye. H25.12   POSTOPERATIVE DIAGNOSIS:    Nuclear sclerotic cataract left eye.     PROCEDURE:  Phacoemusification with posterior chamber intraocular lens placement of the left eye  Ultrasound time: Procedure(s) with comments: CATARACT EXTRACTION PHACO AND INTRAOCULAR LENS PLACEMENT (IOC) LEFT DIABETIC 9.74  01:16.4  12.7%  (Left) - Diabetic - diet controlled  LENS:   Implant Name Type Inv. Item Serial No. Manufacturer Lot No. LRB No. Used Action  LENS IOL DIOP 16.0 - [id number] Intraocular Lens LENS IOL DIOP 16.0 [id number]  Left 1 Implanted      SURGEON:  Wyonia Hough, MD   ANESTHESIA:  Topical with tetracaine drops and 2% Xylocaine jelly, augmented with 1% preservative-free intracameral lidocaine.    COMPLICATIONS:  None.   DESCRIPTION OF PROCEDURE:  The patient was identified in the holding room and transported to the operating room and placed in the supine position under the operating microscope.  The left eye was identified as the operative eye and it was prepped and draped in the usual sterile ophthalmic fashion.   A 1 millimeter clear-corneal paracentesis was made at the 1:30 position.  0.5 ml of preservative-free 1% lidocaine was injected into the anterior chamber.  The anterior chamber was filled with Viscoat viscoelastic.  A 2.4 millimeter keratome was used to make a near-clear corneal incision at the 10:30 position.  .  A curvilinear capsulorrhexis was made with a cystotome and capsulorrhexis forceps.  Balanced salt solution was used to hydrodissect and hydrodelineate the nucleus.   Phacoemulsification was then used in stop and chop fashion to remove the lens nucleus and epinucleus.  The remaining cortex was then removed using the irrigation and aspiration handpiece. Provisc was then placed into the capsular bag to distend it for lens placement.  A  lens was then injected into the capsular bag.  The remaining viscoelastic was aspirated.   Wounds were hydrated with balanced salt solution.  The anterior chamber was inflated to a physiologic pressure with balanced salt solution.  No wound leaks were noted. Cefuroxime 0.1 ml of a 10mg /ml solution was injected into the anterior chamber for a dose of 1 mg of intracameral antibiotic at the completion of the case.   Timolol and Brimonidine drops were applied to the eye.  The patient was taken to the recovery room in stable condition without complications of anesthesia or surgery.  Sasha Rueth 08/10/2019, 9:33 AM

## 2019-08-10 NOTE — Anesthesia Procedure Notes (Signed)
Procedure Name: MAC Date/Time: 08/10/2019 9:16 AM Performed by: Georga Bora, CRNA Pre-anesthesia Checklist: Patient identified, Emergency Drugs available, Suction available, Patient being monitored and Timeout performed Patient Re-evaluated:Patient Re-evaluated prior to induction Oxygen Delivery Method: Nasal cannula

## 2019-08-10 NOTE — Anesthesia Postprocedure Evaluation (Signed)
Anesthesia Post Note  Patient: Melvin Ferguson  Procedure(s) Performed: CATARACT EXTRACTION PHACO AND INTRAOCULAR LENS PLACEMENT (IOC) LEFT DIABETIC 9.74  01:16.4  12.7%  (Left Eye)     Patient location during evaluation: PACU Anesthesia Type: MAC Level of consciousness: awake and alert and oriented Pain management: satisfactory to patient Vital Signs Assessment: post-procedure vital signs reviewed and stable Respiratory status: spontaneous breathing, nonlabored ventilation and respiratory function stable Cardiovascular status: blood pressure returned to baseline and stable Postop Assessment: Adequate PO intake and No signs of nausea or vomiting Anesthetic complications: no    Raliegh Ip

## 2019-08-10 NOTE — Anesthesia Preprocedure Evaluation (Signed)
Anesthesia Evaluation  Patient identified by MRN, date of birth, ID band Patient awake    Reviewed: Allergy & Precautions, H&P , NPO status , Patient's Chart, lab work & pertinent test results  Airway Mallampati: II  TM Distance: >3 FB Neck ROM: full    Dental no notable dental hx.    Pulmonary    Pulmonary exam normal breath sounds clear to auscultation       Cardiovascular hypertension, Normal cardiovascular exam+ pacemaker  Rhythm:regular Rate:Normal     Neuro/Psych    GI/Hepatic   Endo/Other  diabetes  Renal/GU      Musculoskeletal   Abdominal   Peds  Hematology   Anesthesia Other Findings   Reproductive/Obstetrics                             Anesthesia Physical Anesthesia Plan  ASA: III  Anesthesia Plan: MAC   Post-op Pain Management:    Induction:   PONV Risk Score and Plan: 1 and Treatment may vary due to age or medical condition, TIVA and Midazolam  Airway Management Planned:   Additional Equipment:   Intra-op Plan:   Post-operative Plan:   Informed Consent: I have reviewed the patients History and Physical, chart, labs and discussed the procedure including the risks, benefits and alternatives for the proposed anesthesia with the patient or authorized representative who has indicated his/her understanding and acceptance.     Dental Advisory Given  Plan Discussed with: CRNA  Anesthesia Plan Comments:         Anesthesia Quick Evaluation

## 2019-08-11 ENCOUNTER — Encounter: Payer: Self-pay | Admitting: *Deleted

## 2019-08-24 ENCOUNTER — Encounter: Payer: Self-pay | Admitting: Urology

## 2019-08-24 ENCOUNTER — Other Ambulatory Visit: Payer: Self-pay

## 2019-08-24 ENCOUNTER — Ambulatory Visit
Admission: RE | Admit: 2019-08-24 | Discharge: 2019-08-24 | Disposition: A | Discharge status: Home / Self Care | Payer: Medicare Other | Source: Ambulatory Visit | Attending: Urology | Admitting: Urology

## 2019-08-24 ENCOUNTER — Ambulatory Visit (INDEPENDENT_AMBULATORY_CARE_PROVIDER_SITE_OTHER): Payer: Medicare Other | Admitting: Urology

## 2019-08-24 VITALS — BP 125/74 | HR 101 | Ht 73.0 in | Wt 189.3 lb

## 2019-08-24 DIAGNOSIS — N401 Enlarged prostate with lower urinary tract symptoms: Secondary | ICD-10-CM

## 2019-08-24 DIAGNOSIS — R7303 Prediabetes: Secondary | ICD-10-CM | POA: Insufficient documentation

## 2019-08-24 DIAGNOSIS — N138 Other obstructive and reflux uropathy: Secondary | ICD-10-CM

## 2019-08-24 DIAGNOSIS — I10 Essential (primary) hypertension: Secondary | ICD-10-CM | POA: Insufficient documentation

## 2019-08-24 DIAGNOSIS — D649 Anemia, unspecified: Secondary | ICD-10-CM | POA: Insufficient documentation

## 2019-08-24 DIAGNOSIS — Z87442 Personal history of urinary calculi: Secondary | ICD-10-CM | POA: Diagnosis present

## 2019-08-24 DIAGNOSIS — R911 Solitary pulmonary nodule: Secondary | ICD-10-CM | POA: Insufficient documentation

## 2019-08-24 NOTE — Progress Notes (Signed)
08/24/19 9:51 AM   Melvin Ferguson 1932-02-24 [id number]  CC: Nephrolithiasis, BPH  HPI: I saw Melvin Ferguson in urology clinic today for history of nephrolithiasis and BPH.  He was previously followed by Melvin Ferguson.  He is an 86 year old male with well-controlled diabetes, hypertension, cardiac pacemaker followed by Melvin Ferguson, and cataracts who presents to establish care regarding the above issues after Melvin Ferguson moved away.  He reports he has 1 prior episode of nephrolithiasis in the distant past that was managed with shockwave lithotripsy.  He also has a history of a TURP at least 10 years ago with Melvin Ferguson.  He denies any urinary complaints today of urgency, frequency, weak stream, incontinence, or nocturia.  He denies any gross hematuria or flank pain.  He reports that on prior KUBs he had a stable stone that had not enlarged over the last few years.  The last KUB in care everywhere is from November 2019 and reportedly shows a 92mm calcification overlying the left renal shadow that is stable from prior.   PMH: Past Medical History:  Diagnosis Date  . Bradycardia   . Dental bridge present    Permanent Bottom Right  . Diabetes mellitus without complication (HCC)    diet controlled  . Hypertension   . Kidney stones   . Presence of permanent cardiac pacemaker 02/25/2017   [employer] Azure XT DR [id number]  . Prostate disorder     Surgical History: Past Surgical History:  Procedure Laterality Date  . CATARACT EXTRACTION W/PHACO Left 08/10/2019   Procedure: CATARACT EXTRACTION PHACO AND INTRAOCULAR LENS PLACEMENT (IOC) LEFT DIABETIC 9.74  01:16.4  12.7% ;  Surgeon: Leandrew Koyanagi, MD;  Location: [facility];  Service: Ophthalmology;  Laterality: Left;  Diabetic - diet controlled  . HEMORROIDECTOMY    . HERNIA REPAIR    . PACEMAKER INSERTION N/A 02/25/2017   Procedure: INSERTION PACEMAKER;  Surgeon: Isaias Cowman, MD;  Location: [facility];  Service: Cardiovascular;   Laterality: N/A;  . PILONIDAL CYST EXCISION    . PROSTATE SURGERY      Family History: No family history on file.  Social History:  reports that he has never smoked. He has never used smokeless tobacco. He reports that he does not drink alcohol or use drugs.  Physical Exam: BP 125/74 (BP Location: Left Arm, Patient Position: Sitting, Cuff Size: Normal)   Pulse (!) 101   Ht 6\' 1"  (1.854 m)   Wt 189 lb 4.8 oz (85.9 kg)   BMI 24.98 kg/m    Constitutional:  Alert and oriented, No acute distress. Cardiovascular: No clubbing, cyanosis, or edema. Respiratory: Normal respiratory effort, no increased work of breathing. GI: Abdomen is soft, nontender, nondistended, no abdominal masses GU: No CVA tendernes  Laboratory Data: Reviewed, see HPI  Pertinent Imaging: None to review  Assessment & Plan:   In summary, he is a relatively healthy 86 year old male with history of 1 spontaneously passed kidney stone as well as a TURP with Melvin Ferguson in the distant past.  He has no acute complaints today.  We discussed general stone prevention strategies including adequate hydration with goal of producing 2.5 L of urine daily, increasing citric acid intake, increasing calcium intake during high oxalate meals, minimizing animal protein, and decreasing salt intake. Information about dietary recommendations given today.   KUB today to evaluate stone burden, call with results.  If stable continue yearly surveillance.  I spent 45 total minutes on the day of the encounter including pre-visit  review of the medical record, face-to-face time with the patient, and post visit ordering of labs/imaging/tests.  Nickolas Madrid, MD 08/24/2019  [facility] [address] [place], [place] [address] [phone]

## 2019-08-25 ENCOUNTER — Encounter: Payer: Self-pay | Admitting: Ophthalmology

## 2019-08-25 ENCOUNTER — Telehealth: Payer: Self-pay

## 2019-08-25 LAB — URINALYSIS, COMPLETE
Bilirubin, UA: NEGATIVE
Glucose, UA: NEGATIVE
Ketones, UA: NEGATIVE
Leukocytes,UA: NEGATIVE
Nitrite, UA: NEGATIVE
Protein,UA: NEGATIVE
RBC, UA: NEGATIVE
Specific Gravity, UA: 1.02 (ref 1.005–1.030)
Urobilinogen, Ur: 0.2 mg/dL (ref 0.2–1.0)
pH, UA: 5.5 (ref 5.0–7.5)

## 2019-08-25 LAB — MICROSCOPIC EXAMINATION
Bacteria, UA: NONE SEEN
Epithelial Cells (non renal): NONE SEEN /hpf (ref 0–10)

## 2019-08-25 NOTE — Telephone Encounter (Signed)
Called pt informed him of the information below. Pt gave verbal understanding.  

## 2019-08-25 NOTE — Telephone Encounter (Signed)
-----   Message from Billey Co, MD sent at 08/25/2019  1:47 PM EDT ----- Stable kidney stone on left side no causing any blockage, ok to follow up as scheduled  Nickolas Madrid, MD 08/25/2019

## 2019-08-29 ENCOUNTER — Other Ambulatory Visit
Admission: RE | Admit: 2019-08-29 | Discharge: 2019-08-29 | Disposition: A | Discharge status: Home / Self Care | Payer: Medicare Other | Source: Ambulatory Visit | Attending: Ophthalmology | Admitting: Ophthalmology

## 2019-08-29 DIAGNOSIS — Z20822 Contact with and (suspected) exposure to covid-19: Secondary | ICD-10-CM | POA: Diagnosis not present

## 2019-08-29 DIAGNOSIS — Z01812 Encounter for preprocedural laboratory examination: Secondary | ICD-10-CM | POA: Insufficient documentation

## 2019-08-29 LAB — SARS CORONAVIRUS 2 (TAT 6-24 HRS): SARS Coronavirus 2: NEGATIVE

## 2019-08-30 NOTE — Discharge Instructions (Signed)

## 2019-08-31 ENCOUNTER — Encounter: Payer: Self-pay | Admitting: Ophthalmology

## 2019-08-31 ENCOUNTER — Encounter
Admission: RE | Disposition: A | Discharge status: Home / Self Care | Payer: Self-pay | Source: Home / Self Care | Attending: Ophthalmology

## 2019-08-31 ENCOUNTER — Ambulatory Visit: Payer: Medicare Other | Admitting: Anesthesiology

## 2019-08-31 ENCOUNTER — Other Ambulatory Visit: Payer: Self-pay

## 2019-08-31 ENCOUNTER — Ambulatory Visit
Admission: RE | Admit: 2019-08-31 | Discharge: 2019-08-31 | Disposition: A | Discharge status: Home / Self Care | Payer: Medicare Other | Attending: Ophthalmology | Admitting: Ophthalmology

## 2019-08-31 DIAGNOSIS — Z950 Presence of cardiac pacemaker: Secondary | ICD-10-CM | POA: Insufficient documentation

## 2019-08-31 DIAGNOSIS — E119 Type 2 diabetes mellitus without complications: Secondary | ICD-10-CM | POA: Insufficient documentation

## 2019-08-31 DIAGNOSIS — Z7982 Long term (current) use of aspirin: Secondary | ICD-10-CM | POA: Diagnosis not present

## 2019-08-31 DIAGNOSIS — Z79899 Other long term (current) drug therapy: Secondary | ICD-10-CM | POA: Diagnosis not present

## 2019-08-31 DIAGNOSIS — I10 Essential (primary) hypertension: Secondary | ICD-10-CM | POA: Insufficient documentation

## 2019-08-31 DIAGNOSIS — H2511 Age-related nuclear cataract, right eye: Secondary | ICD-10-CM | POA: Insufficient documentation

## 2019-08-31 HISTORY — DX: Cardiac murmur, unspecified: R01.1

## 2019-08-31 HISTORY — DX: Malignant (primary) neoplasm, unspecified: C80.1

## 2019-08-31 HISTORY — DX: Personal history of other medical treatment: Z92.89

## 2019-08-31 HISTORY — PX: CATARACT EXTRACTION W/PHACO: SHX586

## 2019-08-31 HISTORY — DX: Anemia, unspecified: D64.9

## 2019-08-31 HISTORY — DX: Unspecified hearing loss, unspecified ear: H91.90

## 2019-08-31 HISTORY — DX: Personal history of urinary calculi: Z87.442

## 2019-08-31 HISTORY — DX: Diverticulitis of intestine, part unspecified, without perforation or abscess without bleeding: K57.92

## 2019-08-31 SURGERY — PHACOEMULSIFICATION, CATARACT, WITH IOL INSERTION
Anesthesia: Monitor Anesthesia Care | Site: Eye | Laterality: Right

## 2019-08-31 MED ORDER — ACETAMINOPHEN 325 MG PO TABS: 325.0000 mg | ORAL_TABLET | Freq: Once | ORAL | Status: DC

## 2019-08-31 MED ORDER — MOXIFLOXACIN HCL 0.5 % OP SOLN
1.0000 [drp] | OPHTHALMIC | Status: DC | PRN
  Administered 2019-08-31 (×3): 1 [drp] via OPHTHALMIC

## 2019-08-31 MED ORDER — NA HYALUR & NA CHOND-NA HYALUR 0.4-0.35 ML IO KIT
PACK | INTRAOCULAR | Status: DC | PRN
  Administered 2019-08-31: 1 mL via INTRAOCULAR

## 2019-08-31 MED ORDER — TETRACAINE HCL 0.5 % OP SOLN
1.0000 [drp] | OPHTHALMIC | Status: DC | PRN
  Administered 2019-08-31 (×3): 1 [drp] via OPHTHALMIC

## 2019-08-31 MED ORDER — BRIMONIDINE TARTRATE-TIMOLOL 0.2-0.5 % OP SOLN
OPHTHALMIC | Status: DC | PRN
  Administered 2019-08-31: 1 [drp] via OPHTHALMIC

## 2019-08-31 MED ORDER — LIDOCAINE HCL (PF) 2 % IJ SOLN
INTRAOCULAR | Status: DC | PRN
  Administered 2019-08-31: 08:00:00 1 mL

## 2019-08-31 MED ORDER — EPINEPHRINE PF 1 MG/ML IJ SOLN
INTRAOCULAR | Status: DC | PRN
  Administered 2019-08-31: 09:00:00 65 mL via OPHTHALMIC

## 2019-08-31 MED ORDER — LACTATED RINGERS IV SOLN: 10.0000 mL/h | INTRAVENOUS | Status: DC

## 2019-08-31 MED ORDER — ACETAMINOPHEN 160 MG/5ML PO SOLN: 325.0000 mg | Freq: Once | ORAL | Status: DC

## 2019-08-31 MED ORDER — FENTANYL CITRATE (PF) 100 MCG/2ML IJ SOLN
INTRAMUSCULAR | Status: DC | PRN
  Administered 2019-08-31 (×2): 50 ug via INTRAVENOUS

## 2019-08-31 MED ORDER — ARMC OPHTHALMIC DILATING DROPS
1.0000 "application " | OPHTHALMIC | Status: DC | PRN
  Administered 2019-08-31 (×3): 1 via OPHTHALMIC

## 2019-08-31 MED ORDER — CEFUROXIME OPHTHALMIC INJECTION 1 MG/0.1 ML
INJECTION | OPHTHALMIC | Status: DC | PRN
  Administered 2019-08-31: 0.1 mL via INTRACAMERAL

## 2019-08-31 SURGICAL SUPPLY — 29 items
CANNULA ANT/CHMB 27G (MISCELLANEOUS) ×1 IMPLANT
CANNULA ANT/CHMB 27GA (MISCELLANEOUS) ×3 IMPLANT
GLOVE SURG LX 7.5 STRW (GLOVE) ×4
GLOVE SURG LX STRL 7.5 STRW (GLOVE) ×1 IMPLANT
GLOVE SURG TRIUMPH 8.0 PF LTX (GLOVE) ×3 IMPLANT
GOWN STRL REUS W/ TWL LRG LVL3 (GOWN DISPOSABLE) ×2 IMPLANT
GOWN STRL REUS W/TWL LRG LVL3 (GOWN DISPOSABLE) ×4
LENS IOL DIOP 16.5 (Intraocular Lens) ×3 IMPLANT
LENS IOL TECNIS MONO 16.5 (Intraocular Lens) IMPLANT
MARKER SKIN DUAL TIP RULER LAB (MISCELLANEOUS) ×3 IMPLANT
NDL CAPSULORHEX 25GA (NEEDLE) ×1 IMPLANT
NDL FILTER BLUNT 18X1 1/2 (NEEDLE) ×2 IMPLANT
NDL RETROBULBAR .5 NSTRL (NEEDLE) IMPLANT
NEEDLE CAPSULORHEX 25GA (NEEDLE) ×3 IMPLANT
NEEDLE FILTER BLUNT 18X 1/2SAF (NEEDLE) ×4
NEEDLE FILTER BLUNT 18X1 1/2 (NEEDLE) ×2 IMPLANT
PACK CATARACT BRASINGTON (MISCELLANEOUS) ×3 IMPLANT
PACK EYE AFTER SURG (MISCELLANEOUS) ×3 IMPLANT
PACK OPTHALMIC (MISCELLANEOUS) ×3 IMPLANT
RING MALYGIN 7.0 (MISCELLANEOUS) IMPLANT
SOLUTION OPHTHALMIC SALT (MISCELLANEOUS) ×3 IMPLANT
SUT ETHILON 10-0 CS-B-6CS-B-6 (SUTURE)
SUT VICRYL  9 0 (SUTURE)
SUT VICRYL 9 0 (SUTURE) IMPLANT
SUTURE EHLN 10-0 CS-B-6CS-B-6 (SUTURE) IMPLANT
SYR 3ML LL SCALE MARK (SYRINGE) ×6 IMPLANT
SYR TB 1ML LUER SLIP (SYRINGE) ×3 IMPLANT
WATER STERILE IRR 250ML POUR (IV SOLUTION) ×3 IMPLANT
WIPE NON LINTING 3.25X3.25 (MISCELLANEOUS) ×3 IMPLANT

## 2019-08-31 NOTE — Op Note (Signed)
LOCATION:  [place]   PREOPERATIVE DIAGNOSIS:    Nuclear sclerotic cataract right eye. H25.11   POSTOPERATIVE DIAGNOSIS:  Nuclear sclerotic cataract right eye.     PROCEDURE:  Phacoemusification with posterior chamber intraocular lens placement of the right eye   ULTRASOUND TIME: Procedure(s) with comments: CATARACT EXTRACTION PHACO AND INTRAOCULAR LENS PLACEMENT (IOC) RIGHT DIABETIC (Right) - 10.94 1:03.4 17.3%  LENS:   Implant Name Type Inv. Item Serial No. Manufacturer Lot No. LRB No. Used Action  LENS IOL DIOP 16.5 - [id number] Intraocular Lens LENS IOL DIOP 16.5 [id number]  Right 1 Implanted         SURGEON:  Wyonia Hough, MD   ANESTHESIA:  Topical with tetracaine drops and 2% Xylocaine jelly, augmented with 1% preservative-free intracameral lidocaine.    COMPLICATIONS:  None.   DESCRIPTION OF PROCEDURE:  The patient was identified in the holding room and transported to the operating room and placed in the supine position under the operating microscope.  The right eye was identified as the operative eye and it was prepped and draped in the usual sterile ophthalmic fashion.   A 1 millimeter clear-corneal paracentesis was made at the 12:00 position.  0.5 ml of preservative-free 1% lidocaine was injected into the anterior chamber. The anterior chamber was filled with Viscoat viscoelastic.  A 2.4 millimeter keratome was used to make a near-clear corneal incision at the 9:00 position.  A curvilinear capsulorrhexis was made with a cystotome and capsulorrhexis forceps.  Balanced salt solution was used to hydrodissect and hydrodelineate the nucleus.   Phacoemulsification was then used in stop and chop fashion to remove the lens nucleus and epinucleus.  The remaining cortex was then removed using the irrigation and aspiration handpiece. Provisc was then placed into the capsular bag to distend it for lens placement.  A lens was then injected into the capsular  bag.  The remaining viscoelastic was aspirated.   Wounds were hydrated with balanced salt solution.  The anterior chamber was inflated to a physiologic pressure with balanced salt solution.  No wound leaks were noted. Cefuroxime 0.1 ml of a 10mg /ml solution was injected into the anterior chamber for a dose of 1 mg of intracameral antibiotic at the completion of the case.   Timolol and Brimonidine drops were applied to the eye.  The patient was taken to the recovery room in stable condition without complications of anesthesia or surgery.   Melvin Ferguson 08/31/2019, 8:41 AM

## 2019-08-31 NOTE — Anesthesia Postprocedure Evaluation (Signed)
Anesthesia Post Note  Patient: Melvin Ferguson  Procedure(s) Performed: CATARACT EXTRACTION PHACO AND INTRAOCULAR LENS PLACEMENT (IOC) RIGHT DIABETIC (Right Eye)     Patient location during evaluation: PACU Anesthesia Type: MAC Level of consciousness: awake and alert and oriented Pain management: satisfactory to patient Vital Signs Assessment: post-procedure vital signs reviewed and stable Respiratory status: spontaneous breathing, nonlabored ventilation and respiratory function stable Cardiovascular status: blood pressure returned to baseline and stable Postop Assessment: Adequate PO intake and No signs of nausea or vomiting Anesthetic complications: no    Raliegh Ip

## 2019-08-31 NOTE — Transfer of Care (Signed)
Immediate Anesthesia Transfer of Care Note  Patient: Melvin Ferguson  Procedure(s) Performed: CATARACT EXTRACTION PHACO AND INTRAOCULAR LENS PLACEMENT (IOC) RIGHT DIABETIC (Right Eye)  Patient Location: PACU  Anesthesia Type: MAC  Level of Consciousness: awake, alert  and patient cooperative  Airway and Oxygen Therapy: Patient Spontanous Breathing   Post-op Assessment: Post-op Vital signs reviewed, Patient's Cardiovascular Status Stable, Respiratory Function Stable, Patent Airway and No signs of Nausea or vomiting  Post-op Vital Signs: Reviewed and stable  Complications: No apparent anesthesia complications

## 2019-08-31 NOTE — Anesthesia Procedure Notes (Signed)
Procedure Name: MAC Date/Time: 08/31/2019 8:18 AM Performed by: Vanetta Shawl, CRNA Pre-anesthesia Checklist: Patient identified, Emergency Drugs available, Suction available, Timeout performed and Patient being monitored Patient Re-evaluated:Patient Re-evaluated prior to induction Oxygen Delivery Method: Nasal cannula Placement Confirmation: positive ETCO2

## 2019-08-31 NOTE — Anesthesia Preprocedure Evaluation (Signed)
Anesthesia Evaluation  Patient identified by MRN, date of birth, ID band Patient awake    Reviewed: Allergy & Precautions, H&P , NPO status , Patient's Chart, lab work & pertinent test results  Airway Mallampati: II  TM Distance: >3 FB Neck ROM: full    Dental no notable dental hx.    Pulmonary    Pulmonary exam normal breath sounds clear to auscultation       Cardiovascular hypertension, Normal cardiovascular exam+ pacemaker  Rhythm:regular Rate:Normal     Neuro/Psych    GI/Hepatic   Endo/Other  diabetes  Renal/GU      Musculoskeletal   Abdominal   Peds  Hematology   Anesthesia Other Findings   Reproductive/Obstetrics                             Anesthesia Physical  Anesthesia Plan  ASA: III  Anesthesia Plan: MAC   Post-op Pain Management:    Induction:   PONV Risk Score and Plan: 1 and Treatment may vary due to age or medical condition  Airway Management Planned:   Additional Equipment:   Intra-op Plan:   Post-operative Plan:   Informed Consent: I have reviewed the patients History and Physical, chart, labs and discussed the procedure including the risks, benefits and alternatives for the proposed anesthesia with the patient or authorized representative who has indicated his/her understanding and acceptance.     Dental Advisory Given  Plan Discussed with: CRNA  Anesthesia Plan Comments:         Anesthesia Quick Evaluation

## 2019-08-31 NOTE — H&P (Signed)

## 2019-09-01 ENCOUNTER — Encounter: Payer: Self-pay | Admitting: *Deleted

## 2019-09-17 ENCOUNTER — Encounter: Payer: Self-pay | Admitting: Emergency Medicine

## 2019-09-17 ENCOUNTER — Emergency Department
Admission: EM | Admit: 2019-09-17 | Discharge: 2019-09-17 | Disposition: A | Discharge status: Home / Self Care | Payer: Medicare Other | Attending: Emergency Medicine | Admitting: Emergency Medicine

## 2019-09-17 ENCOUNTER — Emergency Department: Payer: Medicare Other

## 2019-09-17 ENCOUNTER — Other Ambulatory Visit: Payer: Self-pay

## 2019-09-17 DIAGNOSIS — Y93H2 Activity, gardening and landscaping: Secondary | ICD-10-CM | POA: Diagnosis not present

## 2019-09-17 DIAGNOSIS — S0083XA Contusion of other part of head, initial encounter: Secondary | ICD-10-CM

## 2019-09-17 DIAGNOSIS — N182 Chronic kidney disease, stage 2 (mild): Secondary | ICD-10-CM | POA: Insufficient documentation

## 2019-09-17 DIAGNOSIS — W109XXA Fall (on) (from) unspecified stairs and steps, initial encounter: Secondary | ICD-10-CM | POA: Insufficient documentation

## 2019-09-17 DIAGNOSIS — Z950 Presence of cardiac pacemaker: Secondary | ICD-10-CM | POA: Diagnosis not present

## 2019-09-17 DIAGNOSIS — Z7984 Long term (current) use of oral hypoglycemic drugs: Secondary | ICD-10-CM | POA: Diagnosis not present

## 2019-09-17 DIAGNOSIS — Y929 Unspecified place or not applicable: Secondary | ICD-10-CM | POA: Insufficient documentation

## 2019-09-17 DIAGNOSIS — S0990XA Unspecified injury of head, initial encounter: Secondary | ICD-10-CM | POA: Insufficient documentation

## 2019-09-17 DIAGNOSIS — Z79899 Other long term (current) drug therapy: Secondary | ICD-10-CM | POA: Diagnosis not present

## 2019-09-17 DIAGNOSIS — I129 Hypertensive chronic kidney disease with stage 1 through stage 4 chronic kidney disease, or unspecified chronic kidney disease: Secondary | ICD-10-CM | POA: Insufficient documentation

## 2019-09-17 DIAGNOSIS — S0121XA Laceration without foreign body of nose, initial encounter: Secondary | ICD-10-CM | POA: Insufficient documentation

## 2019-09-17 DIAGNOSIS — Y999 Unspecified external cause status: Secondary | ICD-10-CM | POA: Insufficient documentation

## 2019-09-17 DIAGNOSIS — W19XXXA Unspecified fall, initial encounter: Secondary | ICD-10-CM

## 2019-09-17 DIAGNOSIS — E1122 Type 2 diabetes mellitus with diabetic chronic kidney disease: Secondary | ICD-10-CM | POA: Insufficient documentation

## 2019-09-17 MED ORDER — CEPHALEXIN 500 MG PO CAPS: 500.0000 mg | ORAL_CAPSULE | Freq: Two times a day (BID) | ORAL | 0 refills | Status: AC

## 2019-09-17 NOTE — ED Notes (Signed)
See triage note- pt fell up one outside step today and hit the top of his nose. Bleeding is well controlled at this time with a dressing in place, denies use of anticoagulants. Denies LOC. Areas of bruising on forehead.

## 2019-09-17 NOTE — ED Provider Notes (Signed)
[facility] Emergency Department Provider Note  ____________________________________________  Time seen: Approximately 8:46 PM  I have reviewed the triage vital signs and the nursing notes.   HISTORY  Chief Complaint Fall    HPI Melvin Ferguson is a 86 y.o. male who presents the emergency department after mechanical fall this evening.  Patient states that he was out in his yard working in the yard, was climbing the steps to his back deck when the edge of his shoe caught the top step causing her to fall forward.  Patient did strike his face but did not lose consciousness.  Patient sustained a soft tissue injury across the nasal bridge as well as swelling and ecchymosis of the nose.  Patient denies any headache, visual changes, neck pain, chest pain, shortness of breath.  She endorses facial pain and nasal bridge pain at this time.  No medications prior to arrival.   No anticoagulation use        Past Medical History:  Diagnosis Date  . Anemia   . Bradycardia   . Cancer ([place])    skin  . Dental bridge present    Permanent Bottom Right  . Diabetes mellitus without complication (HCC)    diet controlled  . Diverticulitis    osis  . Heart murmur   . History of kidney stones   . HOH (hard of hearing)   . Hypertension   . Kidney stones   . Presence of permanent cardiac pacemaker 02/25/2017   [employer] Azure XT DR [id number]  . Prostate disorder   . Transfusion history     Patient Active Problem List   Diagnosis Date Noted  . Borderline diabetes 08/24/2019  . Essential hypertension 08/24/2019  . Lung nodule 08/24/2019  . Normocytic anemia 08/24/2019  . History of nephrolithiasis 06/30/2019  . LV dysfunction 06/30/2018  . Encounter for general adult medical examination without abnormal findings 12/29/2017  . Status post placement of cardiac pacemaker 12/29/2017  . Sick sinus syndrome ([place]) 02/25/2017  . Incomplete emptying of bladder 02/01/2015  .  Low back pain 05/31/2012  . Renal colic [id number]  . Benign localized prostatic hyperplasia with lower urinary tract symptoms (LUTS) 02/09/2012  . Chronic kidney disease, stage II (mild) 02/09/2012  . Kidney stone 02/09/2012  . 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 Basal cell carcinoma of face 10/06/2011    Past Surgical History:  Procedure Laterality Date  . CATARACT EXTRACTION W/PHACO Left 08/10/2019   Procedure: CATARACT EXTRACTION PHACO AND INTRAOCULAR LENS PLACEMENT (IOC) LEFT DIABETIC 9.74  01:16.4  12.7% ;  Surgeon: Leandrew Koyanagi, MD;  Location: [place];  Service: Ophthalmology;  Laterality: Left;  Diabetic - diet controlled  . CATARACT EXTRACTION W/PHACO Right 08/31/2019   Procedure: CATARACT EXTRACTION PHACO AND INTRAOCULAR LENS PLACEMENT ([place]) RIGHT DIABETIC;  Surgeon: Leandrew Koyanagi, MD;  Location: [place];  Service: Ophthalmology;  Laterality: Right;  10.94 1:03.4 17.3%  . COLONOSCOPY    . HEMORROIDECTOMY    . HERNIA REPAIR    . PACEMAKER INSERTION N/A 02/25/2017   Procedure: INSERTION PACEMAKER;  Surgeon: Isaias Cowman, MD;  Location: [facility];  Service: Cardiovascular;  Laterality: N/A;  . penile resection    . PILONIDAL CYST EXCISION    . PROSTATE SURGERY      Prior to Admission medications   Medication Sig Start Date End Date Taking? Authorizing Provider  acetaminophen (TYLENOL) 500 MG tablet Take by mouth.    [provider]  aspirin 81 MG EC tablet Take by mouth. 02/09/12   [provider]  bismuth subsalicylate (PEPTO BISMOL) 262 MG chewable tablet Chew by mouth.    [provider]  Calcium Citrate-Vitamin D (CITRACAL PETITES/VITAMIN D) 200-250 MG-UNIT TABS Take by mouth.    [provider]  cephALEXin (KEFLEX) 500 MG capsule Take 1 capsule (500 mg total) by mouth 2 (two) times daily for 7 days. 09/17/19 09/24/19  Takesha Steger, Charline Bills, PA-C  lisinopril  (PRINIVIL,ZESTRIL) 30 MG tablet Take 1 tablet by mouth daily. 01/29/17   [provider]  Magnesium 250 MG TABS Take 1 tablet by mouth daily.    [provider]  Multiple Vitamins-Minerals (CENTRUM SILVER ULTRA MENS PO) Take by mouth.    [provider]  naproxen sodium (ALEVE) 220 MG tablet Take by mouth.    [provider]  Probiotic Product (CULTURELLE PROBIOTICS PO) Take by mouth daily.    [provider]  psyllium (METAMUCIL) 58.6 % packet Take 1 packet by mouth daily.    [provider]  TRUE METRIX BLOOD GLUCOSE TEST test strip daily. for testing as directed 04/20/19   [provider]    Allergies Patient has no known allergies.  No family history on file.  Social History Social History   Tobacco Use  . Smoking status: Never Smoker  . Smokeless tobacco: Never Used  Substance Use Topics  . Alcohol use: No  . Drug use: No     Review of Systems  Constitutional: No fever/chills Eyes: No visual changes. No discharge ENT: No upper respiratory complaints. Cardiovascular: no chest pain. Respiratory: no cough. No SOB. Gastrointestinal: No abdominal pain.  No nausea, no vomiting.  No diarrhea.  No constipation. Musculoskeletal: Facial injury Skin: Negative for rash, abrasions, lacerations, ecchymosis. Neurological: Negative for headaches, focal weakness or numbness. 10-point ROS otherwise negative.  ____________________________________________   PHYSICAL EXAM:  VITAL SIGNS: ED Triage Vitals  Enc Vitals Group     BP 09/17/19 1906 139/74     Pulse Rate 09/17/19 1906 99     Resp 09/17/19 1906 18     Temp 09/17/19 1906 (!) 97.5 F (36.4 C)     Temp Source 09/17/19 1906 Oral     SpO2 09/17/19 1906 97 %     Weight 09/17/19 1907 190 lb (86.2 kg)     Height 09/17/19 1907 6\' 1"  (1.854 m)     Head Circumference --      Peak Flow --      Pain Score 09/17/19 1906 2     Pain Loc --      Pain Edu? --      Excl.  in [place]? --      Constitutional: Alert and oriented. Well appearing and in no acute distress. Eyes: Conjunctivae are normal. PERRL. EOMI. Head: Patient with superficial abrasions and ecchymosis noted to the left forehead and left cheek.  Superficial laceration to the nasal bridge with surrounding edema and ecchymosis along the nasal bridge.  No deformity of the nasal bridge identified.  No evidence of epistaxis.  Patient is tender to palpation along the nasal bridge, extending into the bilateral inferior orbital region.  No palpable abnormality or crepitus.  No subcutaneous emphysema. ENT:      Ears:       Nose: No congestion/rhinnorhea.      Mouth/Throat: Mucous membranes are moist.  Neck: No stridor.  No cervical spine tenderness to palpation.  Cardiovascular: Normal rate, regular rhythm. Normal S1  and S2.  Good peripheral circulation. Respiratory: Normal respiratory effort without tachypnea or retractions. Lungs CTAB. Good air entry to the bases with no decreased or absent breath sounds. Musculoskeletal: Full range of motion to all extremities. No gross deformities appreciated. Neurologic:  Normal speech and language. No gross focal neurologic deficits are appreciated.  Cranial nerves II through XII grossly intact. Skin:  Skin is warm, dry and intact. No rash noted. Psychiatric: Mood and affect are normal. Speech and behavior are normal. Patient exhibits appropriate insight and judgement.   ____________________________________________   LABS (all labs ordered are listed, but only abnormal results are displayed)  Labs Reviewed - No data to display ____________________________________________  EKG   ____________________________________________  RADIOLOGY I personally viewed and evaluated these images as part of my medical decision making, as well as reviewing the written report by the radiologist.  CT Head Wo Contrast  Result Date: 09/17/2019 CLINICAL DATA:  [place], hit nose, nasal  swelling EXAM: CT HEAD WITHOUT CONTRAST CT MAXILLOFACIAL WITHOUT CONTRAST CT CERVICAL SPINE WITHOUT CONTRAST TECHNIQUE: Multidetector CT imaging of the head, cervical spine, and maxillofacial structures were performed using the standard protocol without intravenous contrast. Multiplanar CT image reconstructions of the cervical spine and maxillofacial structures were also generated. COMPARISON:  11/05/2013 FINDINGS: CT HEAD FINDINGS Brain: No acute infarct or hemorrhage. Chronic left basal ganglia lacunar infarct unchanged. Lateral ventricles and remaining midline structures are unremarkable. No acute extra-axial fluid collections. No mass effect. Vascular: No hyperdense vessel or unexpected calcification. Skull: Normal. Negative for fracture or focal lesion. Other: None. CT MAXILLOFACIAL FINDINGS Osseous: No acute displaced fractures. Orbits: Negative. No traumatic or inflammatory finding. Sinuses: Clear. Soft tissues: Soft tissue swelling overlying the nasal bridge. Remaining soft tissues are normal. CT CERVICAL SPINE FINDINGS Alignment: Alignment is grossly anatomic. Skull base and vertebrae: No acute displaced cervical spine fractures. Soft tissues and spinal canal: No prevertebral fluid or swelling. No visible canal hematoma. 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 There is also bony fusion of the C5/C6 facet joints. There is diffuse disc space narrowing and osteophyte formation most pronounced at C4/C5 with resulting neural foraminal encroachment. Upper chest: Airway is patent. Lung apices are clear. Other: Reconstructed images demonstrate no additional findings. IMPRESSION: CT HEAD: 1. No acute intracranial process. 2. Chronic left basal ganglia lacunar infarct. CT MAXILLOFACIAL: 1. Soft tissue swelling overlying the nasal bridge. 2. No acute displaced facial bone fracture. CT CERVICAL SPINE: 1. No  acute cervical spine fracture. 2. Multilevel spondylosis and facet hypertrophy. Electronically Signed   By: Randa Ngo M.D.   On: 09/17/2019 20:12   CT Cervical Spine Wo Contrast  Result Date: 09/17/2019 CLINICAL DATA:  [place], hit nose, nasal swelling EXAM: CT HEAD WITHOUT CONTRAST CT MAXILLOFACIAL WITHOUT CONTRAST CT CERVICAL SPINE WITHOUT CONTRAST TECHNIQUE: Multidetector CT imaging of the head, cervical spine, and maxillofacial structures were performed using the standard protocol without intravenous contrast. Multiplanar CT image reconstructions of the cervical spine and maxillofacial structures were also generated. COMPARISON:  11/05/2013 FINDINGS: CT HEAD FINDINGS Brain: No acute infarct or hemorrhage. Chronic left basal ganglia lacunar infarct unchanged. Lateral ventricles and remaining midline structures are unremarkable. No acute extra-axial fluid collections. No mass effect. Vascular: No hyperdense vessel or unexpected calcification. Skull: Normal. Negative for fracture or focal lesion. Other: None. CT MAXILLOFACIAL FINDINGS Osseous: No acute displaced fractures. Orbits: Negative. No traumatic or inflammatory finding. Sinuses: Clear. Soft tissues:  Soft tissue swelling overlying the nasal bridge. Remaining soft tissues are normal. CT CERVICAL SPINE FINDINGS Alignment: Alignment is grossly anatomic. Skull base and vertebrae: No acute displaced cervical spine fractures. Soft tissues and spinal canal: No prevertebral fluid or swelling. No visible canal hematoma. 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 There is also bony fusion of the C5/C6 facet joints. There is diffuse disc space narrowing and osteophyte formation most pronounced at C4/C5 with resulting neural foraminal encroachment. Upper chest: Airway is patent. Lung apices are clear. Other: Reconstructed images demonstrate no additional  findings. IMPRESSION: CT HEAD: 1. No acute intracranial process. 2. Chronic left basal ganglia lacunar infarct. CT MAXILLOFACIAL: 1. Soft tissue swelling overlying the nasal bridge. 2. No acute displaced facial bone fracture. CT CERVICAL SPINE: 1. No acute cervical spine fracture. 2. Multilevel spondylosis and facet hypertrophy. Electronically Signed   By: Randa Ngo M.D.   On: 09/17/2019 20:12   CT Maxillofacial Wo Contrast  Result Date: 09/17/2019 CLINICAL DATA:  [place], hit nose, nasal swelling EXAM: CT HEAD WITHOUT CONTRAST CT MAXILLOFACIAL WITHOUT CONTRAST CT CERVICAL SPINE WITHOUT CONTRAST TECHNIQUE: Multidetector CT imaging of the head, cervical spine, and maxillofacial structures were performed using the standard protocol without intravenous contrast. Multiplanar CT image reconstructions of the cervical spine and maxillofacial structures were also generated. COMPARISON:  11/05/2013 FINDINGS: CT HEAD FINDINGS Brain: No acute infarct or hemorrhage. Chronic left basal ganglia lacunar infarct unchanged. Lateral ventricles and remaining midline structures are unremarkable. No acute extra-axial fluid collections. No mass effect. Vascular: No hyperdense vessel or unexpected calcification. Skull: Normal. Negative for fracture or focal lesion. Other: None. CT MAXILLOFACIAL FINDINGS Osseous: No acute displaced fractures. Orbits: Negative. No traumatic or inflammatory finding. Sinuses: Clear. Soft tissues: Soft tissue swelling overlying the nasal bridge. Remaining soft tissues are normal. CT CERVICAL SPINE FINDINGS Alignment: Alignment is grossly anatomic. Skull base and vertebrae: No acute displaced cervical spine fractures. Soft tissues and spinal canal: No prevertebral fluid or swelling. No visible canal hematoma. 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 There is also bony fusion of the C5/C6 facet  joints. There is diffuse disc space narrowing and osteophyte formation most pronounced at C4/C5 with resulting neural foraminal encroachment. Upper chest: Airway is patent. Lung apices are clear. Other: Reconstructed images demonstrate no additional findings. IMPRESSION: CT HEAD: 1. No acute intracranial process. 2. Chronic left basal ganglia lacunar infarct. CT MAXILLOFACIAL: 1. Soft tissue swelling overlying the nasal bridge. 2. No acute displaced facial bone fracture. CT CERVICAL SPINE: 1. No acute cervical spine fracture. 2. Multilevel spondylosis and facet hypertrophy. Electronically Signed   By: Randa Ngo M.D.   On: 09/17/2019 20:12    ____________________________________________    PROCEDURES  Procedure(s) performed:    Marland KitchenMarland KitchenLaceration Repair  Date/Time: 09/17/2019 9:24 PM Performed by: Darletta Moll, PA-C Authorized by: Darletta Moll, PA-C   Consent:    Consent obtained:  Verbal   Consent given by:  Patient   Risks discussed:  Pain, poor cosmetic result, poor wound healing and infection Anesthesia (see MAR for exact dosages):    Anesthesia method:  None Laceration details:    Location:  Face   Face location:  Nose   Length (cm):  0.5 Repair type:    Repair type:  Simple Exploration:    Hemostasis achieved with:  Direct pressure   Wound exploration: wound explored through full range of motion     Wound extent: no underlying fracture noted     Contaminated: no   Treatment:    Area cleansed with:  Shur-Clens   Amount of cleaning:  Standard Skin repair:    Repair method:  Tissue adhesive Post-procedure details:    Dressing:  Open (no dressing)   Patient tolerance of procedure:  Tolerated well, no immediate complications      Medications - No data to display   ____________________________________________   INITIAL IMPRESSION / ASSESSMENT AND PLAN / ED COURSE  Pertinent labs & imaging results that were available during my care of the patient  were reviewed by me and considered in my medical decision making (see chart for details).  Review of the [place] CSRS was performed in accordance of the [place] prior to dispensing any controlled drugs.           Patient's diagnosis is consistent with fall, facial contusion, laceration and nasal bridge.  Patient presented to the emergency department after a mechanical fall.  Patient did strike his face and sustained superficial lacerations along the nasal bridge.  Imaging is reassuring with no indication of fracture, acute intracranial abnormality.  Differential included nasal fracture, facial laceration, intracranial hemorrhage, skull fracture.  Nasal laceration is sealed with Dermabond.  Patient be placed on antibiotics prophylactically.  Follow-up primary care provider as needed.  Patient is given ED precautions to return to the ED for any worsening or new symptoms.     ____________________________________________  FINAL CLINICAL IMPRESSION(S) / ED DIAGNOSES  Final diagnoses:  Fall, initial encounter  Contusion of face, initial encounter  Laceration of nose, initial encounter      NEW MEDICATIONS STARTED DURING THIS VISIT:  ED Discharge Orders         Ordered    cephALEXin (KEFLEX) 500 MG capsule  2 times daily     09/17/19 2123              This chart was dictated using voice recognition software/Dragon. Despite best efforts to proofread, errors can occur which can change the meaning. Any change was purely unintentional.    Brynda Peon 09/17/19 2124    Nance Pear, MD 09/17/19 [phone]

## 2019-09-17 NOTE — ED Triage Notes (Signed)
Pt states that he was in his backyard and going inside where he fell up the steps and hit his nose. Pt denies use of anticoagulants or LOC. Pt is ambulatory to triage and has slight swelling noted to nose. Pt is in NAD.

## 2019-10-30 ENCOUNTER — Encounter: Payer: Self-pay | Admitting: Emergency Medicine

## 2019-10-30 ENCOUNTER — Other Ambulatory Visit: Payer: Self-pay

## 2019-10-30 ENCOUNTER — Emergency Department
Admission: EM | Admit: 2019-10-30 | Discharge: 2019-10-30 | Disposition: A | Discharge status: Home / Self Care | Payer: Medicare Other | Attending: Emergency Medicine | Admitting: Emergency Medicine

## 2019-10-30 DIAGNOSIS — R197 Diarrhea, unspecified: Secondary | ICD-10-CM | POA: Insufficient documentation

## 2019-10-30 DIAGNOSIS — Z79899 Other long term (current) drug therapy: Secondary | ICD-10-CM | POA: Insufficient documentation

## 2019-10-30 DIAGNOSIS — I129 Hypertensive chronic kidney disease with stage 1 through stage 4 chronic kidney disease, or unspecified chronic kidney disease: Secondary | ICD-10-CM | POA: Diagnosis not present

## 2019-10-30 DIAGNOSIS — E1122 Type 2 diabetes mellitus with diabetic chronic kidney disease: Secondary | ICD-10-CM | POA: Diagnosis not present

## 2019-10-30 DIAGNOSIS — Z950 Presence of cardiac pacemaker: Secondary | ICD-10-CM | POA: Insufficient documentation

## 2019-10-30 DIAGNOSIS — N182 Chronic kidney disease, stage 2 (mild): Secondary | ICD-10-CM | POA: Insufficient documentation

## 2019-10-30 LAB — COMPREHENSIVE METABOLIC PANEL
ALT: 25 U/L (ref 0–44)
AST: 27 U/L (ref 15–41)
Albumin: 3.6 g/dL (ref 3.5–5.0)
Alkaline Phosphatase: 60 U/L (ref 38–126)
Anion gap: 8 (ref 5–15)
BUN: 24 mg/dL — ABNORMAL HIGH (ref 8–23)
CO2: 21 mmol/L — ABNORMAL LOW (ref 22–32)
Calcium: 8.1 mg/dL — ABNORMAL LOW (ref 8.9–10.3)
Chloride: 101 mmol/L (ref 98–111)
Creatinine, Ser: 0.93 mg/dL (ref 0.61–1.24)
GFR calc Af Amer: >60 mL/min (ref >60)
GFR calc non Af Amer: >60 mL/min (ref >60)
Glucose, Bld: 101 mg/dL — ABNORMAL HIGH (ref 70–99)
Potassium: 3.6 mmol/L (ref 3.5–5.1)
Sodium: 130 mmol/L — ABNORMAL LOW (ref 135–145)
Total Bilirubin: 0.8 mg/dL (ref 0.3–1.2)
Total Protein: 6.3 g/dL — ABNORMAL LOW (ref 6.5–8.1)

## 2019-10-30 LAB — URINALYSIS, COMPLETE (UACMP) WITH MICROSCOPIC
Bacteria, UA: NONE SEEN
Bilirubin Urine: NEGATIVE
Glucose, UA: NEGATIVE mg/dL
Hgb urine dipstick: NEGATIVE
Ketones, ur: NEGATIVE mg/dL
Leukocytes,Ua: NEGATIVE
Nitrite: NEGATIVE
Protein, ur: NEGATIVE mg/dL
Specific Gravity, Urine: 1.008 (ref 1.005–1.030)
Squamous Epithelial / HPF: NONE SEEN (ref 0–5)
pH: 5 (ref 5.0–8.0)

## 2019-10-30 LAB — CBC
HCT: 31.9 % — ABNORMAL LOW (ref 39.0–52.0)
Hemoglobin: 11.1 g/dL — ABNORMAL LOW (ref 13.0–17.0)
MCH: 32 pg (ref 26.0–34.0)
MCHC: 34.8 g/dL (ref 30.0–36.0)
MCV: 91.9 fL (ref 80.0–100.0)
Platelets: 135 10*3/uL — ABNORMAL LOW (ref 150–400)
RBC: 3.47 MIL/uL — ABNORMAL LOW (ref 4.22–5.81)
RDW: 12.8 % (ref 11.5–15.5)
WBC: 5.1 10*3/uL (ref 4.0–10.5)
nRBC: 0 % (ref 0.0–0.2)

## 2019-10-30 LAB — LIPASE, BLOOD: Lipase: 47 U/L (ref 11–51)

## 2019-10-30 MED ORDER — ONDANSETRON 4 MG PO TBDP
4.0000 mg | ORAL_TABLET | Freq: Three times a day (TID) | ORAL | 0 refills | Status: AC | PRN
Start: 2019-10-30 — End: ?

## 2019-10-30 MED ORDER — SODIUM CHLORIDE 0.9 % IV BOLUS
1000.0000 mL | Freq: Once | INTRAVENOUS | Status: AC
  Administered 2019-10-30: 1000 mL via INTRAVENOUS

## 2019-10-30 NOTE — ED Triage Notes (Signed)
Here for diarrhea for last 3 days.  No vomiting. denies any pain.   No fevers.  Reports diarrhea is constant and will have accidents because cannot get to the bathroom.

## 2019-10-30 NOTE — ED Notes (Signed)
Pt transported to bedside toilet at this time. Pt tolerated well with no assistance by this RN.

## 2019-10-30 NOTE — ED Notes (Signed)
Pt transported back to bed and reconnected to fluids. Patient tolerated well, connected back to monitor and comfortable in bed at this time. Patient denies further needs.

## 2019-10-30 NOTE — Discharge Instructions (Signed)
Please follow-up with your primary care provider if your symptoms last more than 2 days.  Follow-up with your primary care sooner or return to the emergency department for symptoms that change or worsen or if your fever returns.

## 2019-10-30 NOTE — ED Provider Notes (Signed)
[facility] Emergency Department Provider Note ____________________________________________   First MD Initiated Contact with Patient 10/30/19 1606     (approximate)  I have reviewed the triage vital signs and the nursing notes.   HISTORY  Chief Complaint Diarrhea  HPI Melvin Ferguson is a 86 y.o. male who presents to the emergency department for treatment and evaluation after 3 days of diarrhea.  He states that he has had multiple episodes of uncontrollable diarrhea.  He has been drinking lots of water and Gatorade.  He denies abdominal pain.  No dysuria.  No nausea or vomiting.  Wife had similar symptoms last week but she related it to stool softener.  Patient states that today he has noticed that the diarrhea has been less frequent and his son states that his color has improved.  The patient states that he feels better today than he has in the past couple days.     Past Medical History:  Diagnosis Date   Anemia    Bradycardia    Cancer ([place])    skin   Dental bridge present    Permanent Bottom Right   Diabetes mellitus without complication ([place])    diet controlled   Diverticulitis    osis   Heart murmur    History of kidney stones    HOH (hard of hearing)    Hypertension    Kidney stones    Presence of permanent cardiac pacemaker 02/25/2017   Medtronics Azure XT DR [id number]   Prostate disorder    Transfusion history     Patient Active Problem List   Diagnosis Date Noted   Borderline diabetes 08/24/2019   Essential hypertension 08/24/2019   Lung nodule 08/24/2019   Normocytic anemia 08/24/2019   History of nephrolithiasis 06/30/2019   LV dysfunction 06/30/2018   Encounter for general adult medical examination without abnormal findings 12/29/2017   Status post placement of cardiac pacemaker 12/29/2017   Sick sinus syndrome ([place]) 02/25/2017   Incomplete emptying of bladder 02/01/2015   Low back pain 01/60/1093    Renal colic 23/55/7322   Benign localized prostatic hyperplasia with lower urinary tract symptoms (LUTS) 02/09/2012   Chronic kidney disease, stage II (mild) 02/09/2012   Kidney stone 02/09/2012   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   Basal cell carcinoma of face 10/06/2011    Past Surgical History:  Procedure Laterality Date   CATARACT EXTRACTION W/PHACO Left 08/10/2019   Procedure: CATARACT EXTRACTION PHACO AND INTRAOCULAR LENS PLACEMENT (IOC) LEFT DIABETIC 9.74  01:16.4  12.7% ;  Surgeon: Leandrew Koyanagi, MD;  Location: [place];  Service: Ophthalmology;  Laterality: Left;  Diabetic - diet controlled   CATARACT EXTRACTION W/PHACO Right 08/31/2019   Procedure: CATARACT EXTRACTION PHACO AND INTRAOCULAR LENS PLACEMENT ([place]) RIGHT DIABETIC;  Surgeon: Leandrew Koyanagi, MD;  Location: [place];  Service: Ophthalmology;  Laterality: Right;  10.94 1:03.4 17.3%   COLONOSCOPY     HEMORROIDECTOMY     HERNIA REPAIR     PACEMAKER INSERTION N/A 02/25/2017   Procedure: INSERTION PACEMAKER;  Surgeon: Isaias Cowman, MD;  Location: [facility];  Service: Cardiovascular;  Laterality: N/A;   penile resection     PILONIDAL CYST EXCISION     PROSTATE SURGERY      Prior to Admission medications   Medication Sig Start Date End Date Taking? Authorizing Provider  acetaminophen (TYLENOL) 500 MG tablet Take by mouth.    [provider]  aspirin 81 MG EC tablet  Take by mouth. 02/09/12   [provider]  bismuth subsalicylate (PEPTO BISMOL) 262 MG chewable tablet Chew by mouth.    [provider]  Calcium Citrate-Vitamin D (CITRACAL PETITES/VITAMIN D) 200-250 MG-UNIT TABS Take by mouth.    [provider]  lisinopril (PRINIVIL,ZESTRIL) 30 MG tablet Take 1 tablet by mouth daily. 01/29/17   [provider]  Magnesium 250 MG TABS Take 1 tablet by mouth daily.    [provider]  Multiple  Vitamins-Minerals (CENTRUM SILVER ULTRA MENS PO) Take by mouth.    [provider]  naproxen sodium (ALEVE) 220 MG tablet Take by mouth.    [provider]  ondansetron (ZOFRAN-ODT) 4 MG disintegrating tablet Take 1 tablet (4 mg total) by mouth every 8 (eight) hours as needed for nausea or vomiting. 10/30/19   Delshon Blanchfield, Johnette Abraham B, FNP  Probiotic Product (CULTURELLE PROBIOTICS PO) Take by mouth daily.    [provider]  psyllium (METAMUCIL) 58.6 % packet Take 1 packet by mouth daily.    [provider]  TRUE METRIX BLOOD GLUCOSE TEST test strip daily. for testing as directed 04/20/19   [provider]    Allergies Patient has no known allergies.  History reviewed. No pertinent family history.  Social History Social History   Tobacco Use   Smoking status: Never Smoker   Smokeless tobacco: Never Used  Scientific laboratory technician Use: Never used  Substance Use Topics   Alcohol use: No   Drug use: No    Review of Systems  Constitutional: No fever/chills Eyes: No visual changes. ENT: No sore throat. Cardiovascular: Denies chest pain. Respiratory: Denies shortness of breath. Gastrointestinal: No abdominal pain.  No nausea, no vomiting. Positive for diarrhea.  No constipation. Genitourinary: Negative for dysuria. Musculoskeletal: Negative for back pain. Skin: Negative for rash. Neurological: Negative for headaches, focal weakness or numbness. ____________________________________________   PHYSICAL EXAM:  VITAL SIGNS: ED Triage Vitals  Enc Vitals Group     BP 10/30/19 1255 124/72     Pulse Rate 10/30/19 1255 77     Resp 10/30/19 1255 16     Temp 10/30/19 1255 97.6 F (36.4 C)     Temp Source 10/30/19 1255 Oral     SpO2 10/30/19 1255 97 %     Weight 10/30/19 1256 180 lb (81.6 kg)     Height 10/30/19 1256 6\' 1"  (1.854 m)     Head Circumference --      Peak Flow --      Pain Score 10/30/19 1255 0     Pain Loc --      Pain Edu? --        Excl. in [place]? --     Constitutional: Alert and oriented. Well appearing and in no acute distress. Eyes: Conjunctivae are normal. Head: Atraumatic. Nose: No congestion/rhinnorhea. Mouth/Throat: Mucous membranes are moist. Oropharynx non-erythematous. Neck: No stridor.   Hematological/Lymphatic/Immunilogical: No cervical lymphadenopathy. Cardiovascular: Normal rate, regular rhythm. Grossly normal heart sounds.  Good peripheral circulation. Respiratory: Normal respiratory effort.  No retractions. Lungs CTAB. Gastrointestinal: Soft and nontender. No distention. No abdominal bruits. No CVA tenderness. Genitourinary:  Musculoskeletal: No lower extremity tenderness nor edema.  No joint effusions. Neurologic:  Normal speech and language. No gross focal neurologic deficits are appreciated. No gait instability. Skin:  Skin is warm, dry and intact. No rash noted. Psychiatric: Mood and affect are normal. Speech and behavior are normal.  ____________________________________________   LABS (all labs ordered are listed,  but only abnormal results are displayed)  Labs Reviewed  COMPREHENSIVE METABOLIC PANEL - Abnormal; Notable for the following components:      Result Value   Sodium 130 (*)    CO2 21 (*)    Glucose, Bld 101 (*)    BUN 24 (*)    Calcium 8.1 (*)    Total Protein 6.3 (*)    All other components within normal limits  CBC - Abnormal; Notable for the following components:   RBC 3.47 (*)    Hemoglobin 11.1 (*)    HCT 31.9 (*)    Platelets 135 (*)    All other components within normal limits  URINALYSIS, COMPLETE (UACMP) WITH MICROSCOPIC - Abnormal; Notable for the following components:   Color, Urine YELLOW (*)    APPearance CLEAR (*)    All other components within normal limits  LIPASE, BLOOD   ____________________________________________  EKG  Not indicated ____________________________________________  RADIOLOGY  ED MD interpretation:    Not indicated  I,  Sherrie George, personally viewed and evaluated these images (plain radiographs) as part of my medical decision making, as well as reviewing the written report by the radiologist.  Official radiology report(s): No results found.  ____________________________________________   PROCEDURES  Procedure(s) performed (including Critical Care):  Procedures  ____________________________________________   INITIAL IMPRESSION / ASSESSMENT AND PLAN   86 year old male presenting to the emergency department for treatment and evaluation of diarrhea. See HPI for further details. Exam is overall reassuring. Labs drawn while awaiting ER room assignment show a mild hyponatremia but are otherwise reassuring.  DIFFERENTIAL DIAGNOSIS  Viral syndrome, diverticulitis, medication reaction  ED COURSE  No reported diarrhea while here in the ER for several hours. He was rehydrated with normal saline.  He will be discharged home and advised to eat a BRAT diet and continue fluid intake. He is to follow up with PCP for symptoms that continue for more than 48 hours or sooner if he develops abdominal pain or fever. ____________________________________________   FINAL CLINICAL IMPRESSION(S) / ED DIAGNOSES  Final diagnoses:  Diarrhea, unspecified type     ED Discharge Orders         Ordered    ondansetron (ZOFRAN-ODT) 4 MG disintegrating tablet  Every 8 hours PRN     Discontinue  Reprint     10/30/19 1840           Sadrac Zeoli Ambrocio was evaluated in Emergency Department on 10/30/2019 for the symptoms described in the history of present illness. He was evaluated in the context of the global COVID-19 pandemic, which necessitated consideration that the patient might be at risk for infection with the SARS-CoV-2 virus that causes COVID-19. Institutional protocols and algorithms that pertain to the evaluation of patients at risk for COVID-19 are in a state of rapid change based on information released by  regulatory bodies including the CDC and federal and state organizations. These policies and algorithms were followed during the patient's care in the ED.   Note:  This document was prepared using Dragon voice recognition software and may include unintentional dictation errors.   Victorino Dike, FNP 10/30/19 Pollie Meyer    Lavonia Drafts, MD 10/30/19 1901

## 2019-10-30 NOTE — ED Notes (Signed)
Pt visualized in NAD, VS obtained by this RN. Pt alert and oriented at this time, visualized in NAD at this time.

## 2020-08-23 ENCOUNTER — Ambulatory Visit
Admission: RE | Admit: 2020-08-23 | Discharge: 2020-08-23 | Disposition: A | Discharge status: Home / Self Care | Payer: Medicare Other | Source: Ambulatory Visit | Attending: Urology | Admitting: Urology

## 2020-08-23 ENCOUNTER — Ambulatory Visit
Admission: RE | Admit: 2020-08-23 | Discharge: 2020-08-23 | Disposition: A | Discharge status: Home / Self Care | Payer: Medicare Other | Attending: Urology | Admitting: Urology

## 2020-08-23 ENCOUNTER — Encounter: Payer: Self-pay | Admitting: Urology

## 2020-08-23 ENCOUNTER — Ambulatory Visit (INDEPENDENT_AMBULATORY_CARE_PROVIDER_SITE_OTHER): Payer: Medicare Other | Admitting: Urology

## 2020-08-23 ENCOUNTER — Other Ambulatory Visit: Payer: Self-pay

## 2020-08-23 VITALS — BP 131/82 | HR 101 | Ht 72.0 in | Wt 185.0 lb

## 2020-08-23 DIAGNOSIS — N200 Calculus of kidney: Secondary | ICD-10-CM | POA: Diagnosis not present

## 2020-08-23 DIAGNOSIS — N138 Other obstructive and reflux uropathy: Secondary | ICD-10-CM | POA: Diagnosis not present

## 2020-08-23 DIAGNOSIS — N401 Enlarged prostate with lower urinary tract symptoms: Secondary | ICD-10-CM

## 2020-08-23 DIAGNOSIS — Z87442 Personal history of urinary calculi: Secondary | ICD-10-CM | POA: Diagnosis present

## 2020-08-23 NOTE — Progress Notes (Signed)
   08/23/2020 10:39 AM   [name] 1931/08/27 [id number]  Reason for visit: Follow up nephrolithiasis, BPH  HPI: I saw Melvin Ferguson for follow-up of the above issues.  He is an 87 year old male who was previously followed by Dr. Jacqlyn Larsen.  He has a distant history of TURP at least 10 years ago, and urinary symptoms have been well controlled since that time.  He denies any significant bother from urinary symptoms today, no history of gross hematuria, UTIs, or recent retention.  He has a history of 1 stone episode that required shockwave lithotripsy, and has had an 8 mm left lower pole stone that has been stable over the last 5 years or so.  I personally reviewed and interpreted his KUB today that shows a stable 8 mm left lower pole stone.  He denies any flank pain or gross hematuria.  We discussed options again including observation, shockwave lithotripsy, ureteroscopy, and he would like to continue surveillance which is very reasonable.  We discussed general stone prevention strategies including adequate hydration with goal of producing 2.5 L of urine daily, increasing citric acid intake, increasing calcium intake during high oxalate meals, minimizing animal protein, and decreasing salt intake. Information about dietary recommendations given today.   RTC 1 year for KUB and PVR  Billey Co, MD  [facility] [address] [place], [place] [address] [phone]

## 2020-08-23 NOTE — Patient Instructions (Signed)

## 2021-07-22 ENCOUNTER — Other Ambulatory Visit (HOSPITAL_COMMUNITY): Payer: Self-pay | Admitting: Family Medicine

## 2021-07-22 ENCOUNTER — Other Ambulatory Visit: Payer: Self-pay | Admitting: Family Medicine

## 2021-07-22 DIAGNOSIS — M4856XA Collapsed vertebra, not elsewhere classified, lumbar region, initial encounter for fracture: Secondary | ICD-10-CM

## 2021-07-22 DIAGNOSIS — W19XXXA Unspecified fall, initial encounter: Secondary | ICD-10-CM

## 2021-07-29 ENCOUNTER — Ambulatory Visit (HOSPITAL_COMMUNITY)
Admission: RE | Admit: 2021-07-29 | Discharge: 2021-07-29 | Disposition: A | Discharge status: Home / Self Care | Payer: Medicare Other | Source: Ambulatory Visit | Attending: Family Medicine | Admitting: Family Medicine

## 2021-07-29 DIAGNOSIS — M48061 Spinal stenosis, lumbar region without neurogenic claudication: Secondary | ICD-10-CM | POA: Diagnosis not present

## 2021-07-29 DIAGNOSIS — M4317 Spondylolisthesis, lumbosacral region: Secondary | ICD-10-CM | POA: Insufficient documentation

## 2021-07-29 DIAGNOSIS — M4316 Spondylolisthesis, lumbar region: Secondary | ICD-10-CM | POA: Diagnosis not present

## 2021-07-29 DIAGNOSIS — M5137 Other intervertebral disc degeneration, lumbosacral region: Secondary | ICD-10-CM | POA: Diagnosis not present

## 2021-07-29 DIAGNOSIS — M47816 Spondylosis without myelopathy or radiculopathy, lumbar region: Secondary | ICD-10-CM | POA: Diagnosis not present

## 2021-07-29 DIAGNOSIS — M4856XA Collapsed vertebra, not elsewhere classified, lumbar region, initial encounter for fracture: Secondary | ICD-10-CM | POA: Diagnosis present

## 2021-07-29 DIAGNOSIS — M47814 Spondylosis without myelopathy or radiculopathy, thoracic region: Secondary | ICD-10-CM | POA: Insufficient documentation

## 2021-07-29 DIAGNOSIS — M5136 Other intervertebral disc degeneration, lumbar region: Secondary | ICD-10-CM | POA: Diagnosis not present

## 2021-07-29 DIAGNOSIS — W19XXXA Unspecified fall, initial encounter: Secondary | ICD-10-CM | POA: Diagnosis not present

## 2021-07-29 DIAGNOSIS — Y92009 Unspecified place in unspecified non-institutional (private) residence as the place of occurrence of the external cause: Secondary | ICD-10-CM | POA: Diagnosis not present

## 2021-07-29 NOTE — Progress Notes (Signed)
Informed of MRI for today.  ? ?Device system confirmed to be MRI conditional, with implant date > 6 weeks ago, and no evidence of abandoned or epicardial leads in review of most recent CXR ?Interrogation from today reviewed, pt is currently AS-VP at ~95 bpm. Confirmed with MRI staff that HRs currently in 33s.  ? ?Change device settings for MRI to DOO at 90 bpm, up to 105 bpm if needed if rates pick back up.  ? ?Tachy-therapies to off if applicable. ? ?Program device back to pre-MRI settings after completion of exam. ? ?Shirley Friar, PA-C  ?07/29/2021 10:55 AM   ?

## 2021-07-29 NOTE — Progress Notes (Signed)
Patient here today at [facility] for MRI lumbar spine wo contrast. Patient has medtronic device. CLE sent. Orders for DOO 90 up to 105. Patient initial HR 97 when attached to monitor. Will re-program once scan is completed. ?

## 2021-08-22 ENCOUNTER — Ambulatory Visit
Admission: RE | Admit: 2021-08-22 | Discharge: 2021-08-22 | Disposition: A | Discharge status: Home / Self Care | Payer: Medicare Other | Source: Ambulatory Visit | Attending: Urology | Admitting: Urology

## 2021-08-22 ENCOUNTER — Ambulatory Visit (INDEPENDENT_AMBULATORY_CARE_PROVIDER_SITE_OTHER): Payer: Medicare Other | Admitting: Urology

## 2021-08-22 ENCOUNTER — Encounter: Payer: Self-pay | Admitting: Urology

## 2021-08-22 VITALS — BP 125/77 | HR 101 | Ht 72.0 in | Wt 190.0 lb

## 2021-08-22 DIAGNOSIS — Z87442 Personal history of urinary calculi: Secondary | ICD-10-CM | POA: Diagnosis present

## 2021-08-22 DIAGNOSIS — N401 Enlarged prostate with lower urinary tract symptoms: Secondary | ICD-10-CM

## 2021-08-22 DIAGNOSIS — N138 Other obstructive and reflux uropathy: Secondary | ICD-10-CM | POA: Diagnosis not present

## 2021-08-22 DIAGNOSIS — N200 Calculus of kidney: Secondary | ICD-10-CM

## 2021-08-22 LAB — BLADDER SCAN AMB NON-IMAGING

## 2021-08-22 NOTE — Progress Notes (Signed)
? ?  08/22/2021 ?9:27 AM  ? ?Melvin Ferguson ?[birth year] ?[id number] ? ?Reason for visit: Follow up nephrolithiasis, BPH ? ?HPI: ?I saw Melvin Ferguson for follow-up of the above issues.  He is an 88 year old male who was previously followed by Dr. Jacqlyn Larsen.  He has a distant history of TURP at least 10 years ago, and urinary symptoms have been well controlled since that time.  He denies any significant bother from urinary symptoms today, no history of gross hematuria, UTIs, or retention.  PVR is normal at 17 mL. ? ?He has a history of 1 stone episode that required shockwave lithotripsy, and has had an 8 mm left lower pole stone that has been stable over the last 5 years or so.  I personally reviewed and interpreted his KUB today that shows a stable 8 mm left lower pole stone.  He denies any flank pain or gross hematuria.  He would like to continue with observation which is very reasonable.  We discussed return precautions at length including left-sided flank pain or groin pain, or gross hematuria ? ?He has been doing very well over the last few years with really no acute issues, and I think at this point he can follow-up as needed.  He is in agreement.  Return precautions discussed. ? ?Billey Co, MD ? ?[place] ?[address] ?[place], [place] [address] ?[phone] ? ? ?

## 2021-08-30 ENCOUNTER — Ambulatory Visit (HOSPITAL_COMMUNITY): Payer: Medicare Other

## 2022-04-24 IMAGING — CT CT MAXILLOFACIAL W/O CM
3 series · 15 of 47 positions shown, 18 images · non-contrast
Comparison: 11/05/2013

CLINICAL DATA: Fell, hit nose, nasal swelling

EXAM:
CT HEAD WITHOUT CONTRAST
CT MAXILLOFACIAL WITHOUT CONTRAST
CT CERVICAL SPINE WITHOUT CONTRAST
TECHNIQUE: Multidetector CT imaging of the head, cervical spine, and
maxillofacial structures were performed using the standard protocol
without intravenous contrast. Multiplanar CT image reconstructions
of the cervical spine and maxillofacial structures were also
generated.

[Series 3: max soft · axial · 0.36mm/px · z∈[-207,-51]mm · 9 of 92 slices shown, 12 images]
[im 7/92  brain]
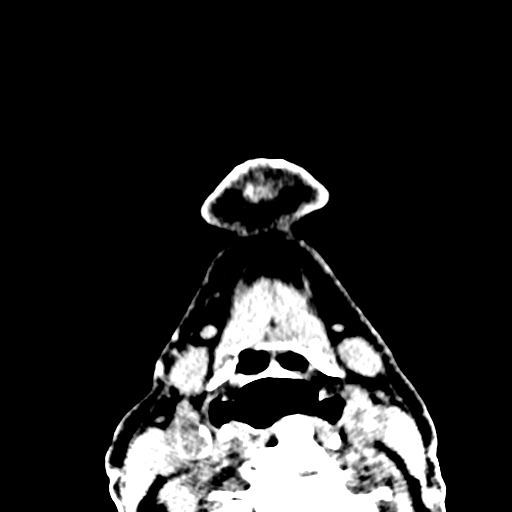
[im 7/92  bone]
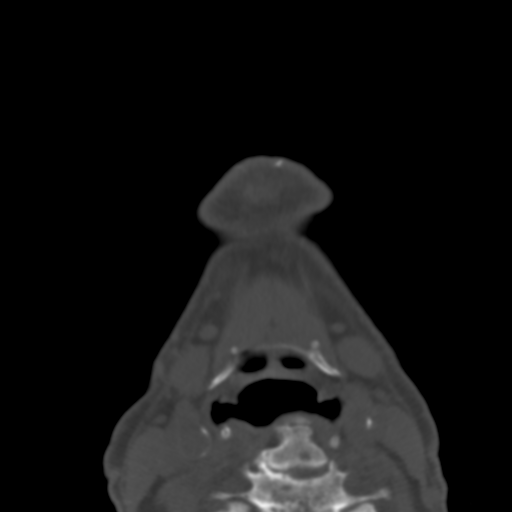
[im 16/92  bone]
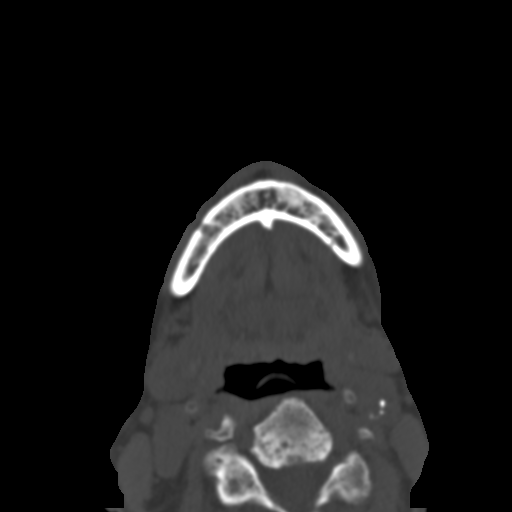
[im 26/92  bone]
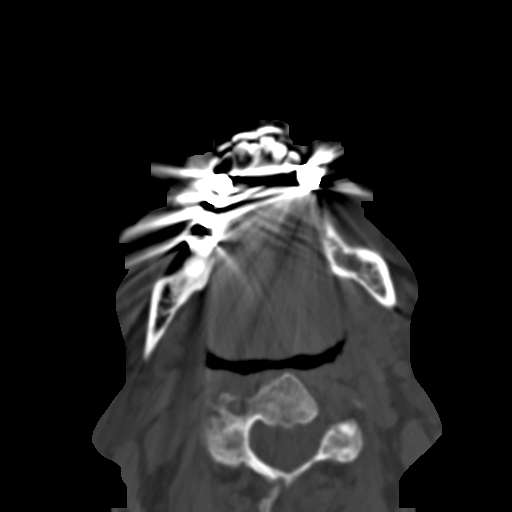
[im 35/92  bone]
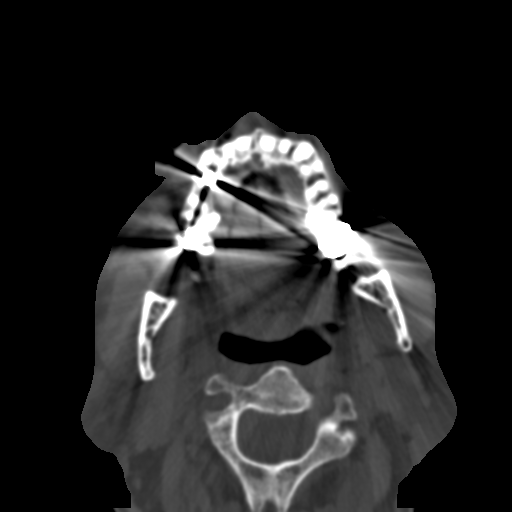
[im 48/92  brain]
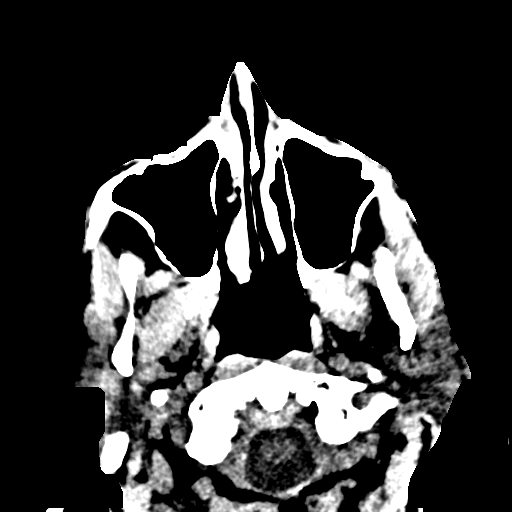
[im 48/92  bone]
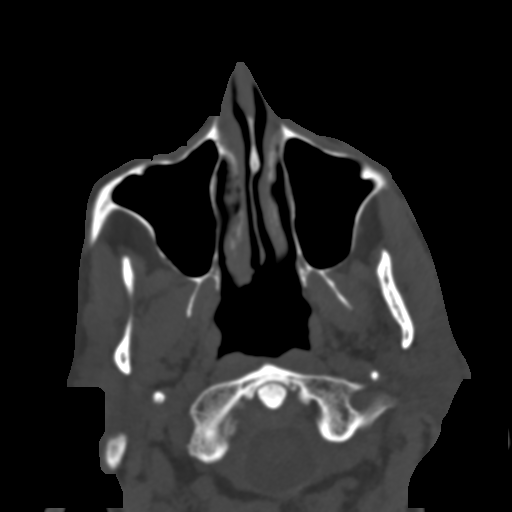
[im 57/92  bone]
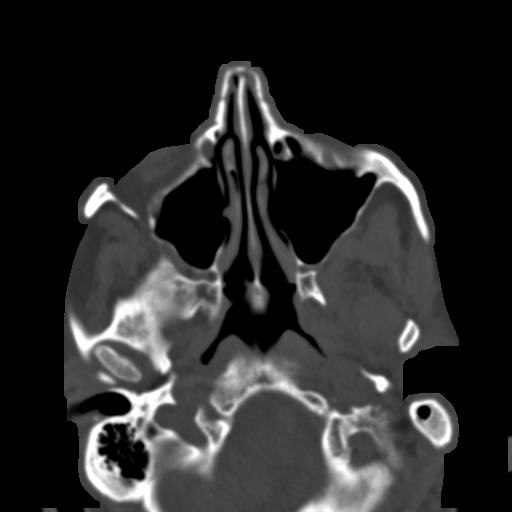
[im 66/92  bone]
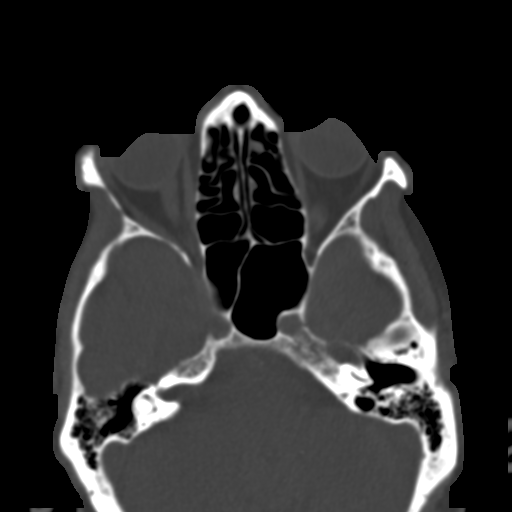
[im 76/92  bone]
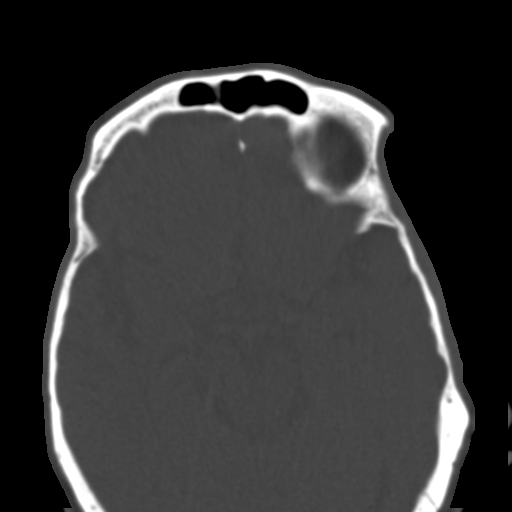
[im 85/92  brain]
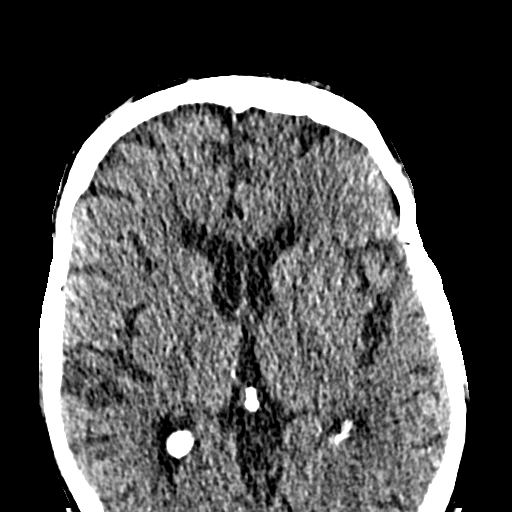
[im 85/92  bone]
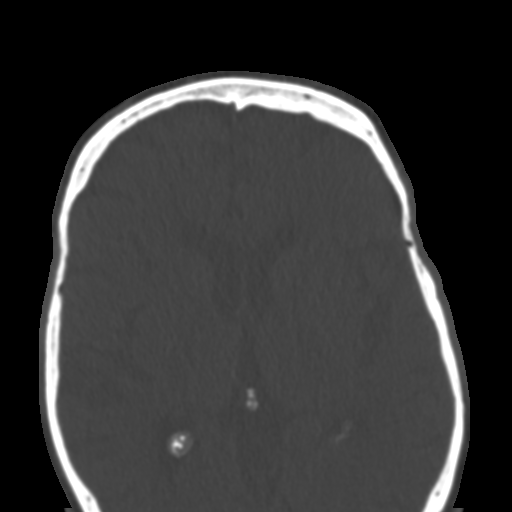

[Series 6: coronal soft · coronal · 0.33mm/px · 3 of 84 slices shown]
[im 28/84  bone]
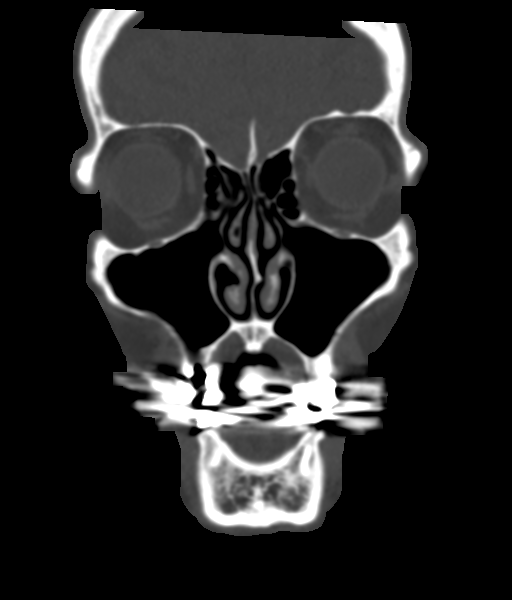
[im 37/84  bone]
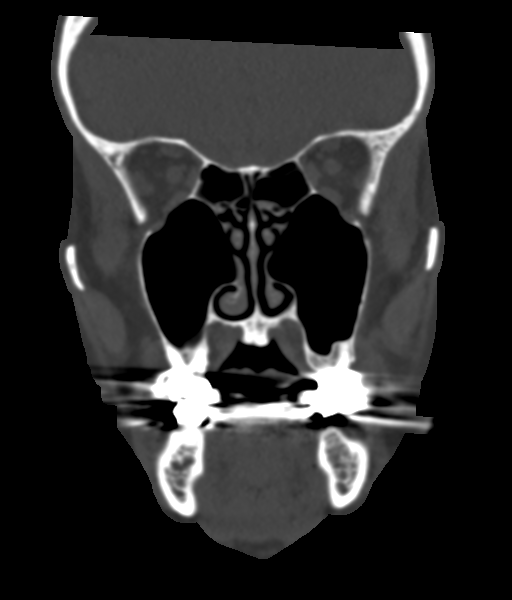
[im 47/84  bone]
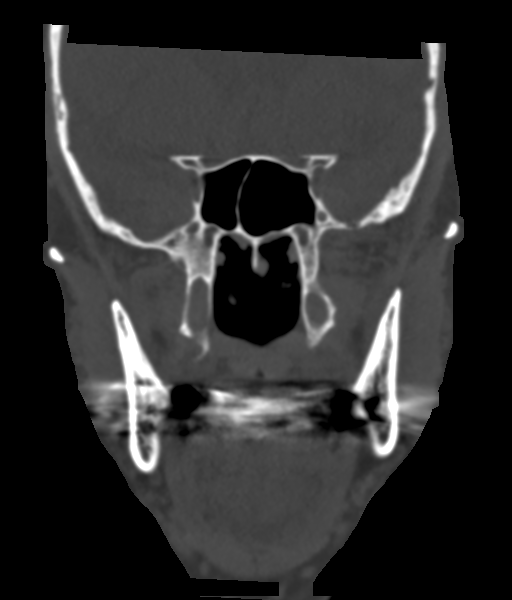

[Series 9: sagittal bone · sagittal · 0.33mm/px · 3 of 85 slices shown]
[im 29/85  bone]
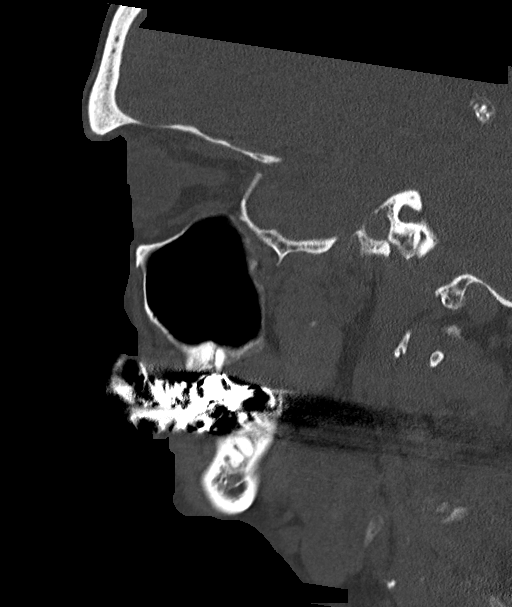
[im 43/85  bone]
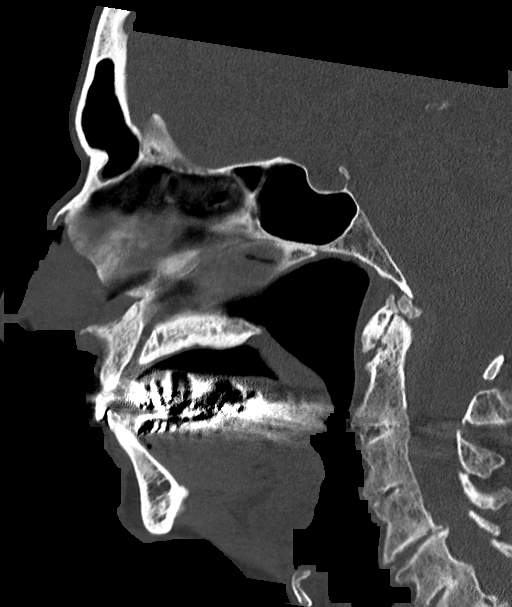
[im 57/85  bone]
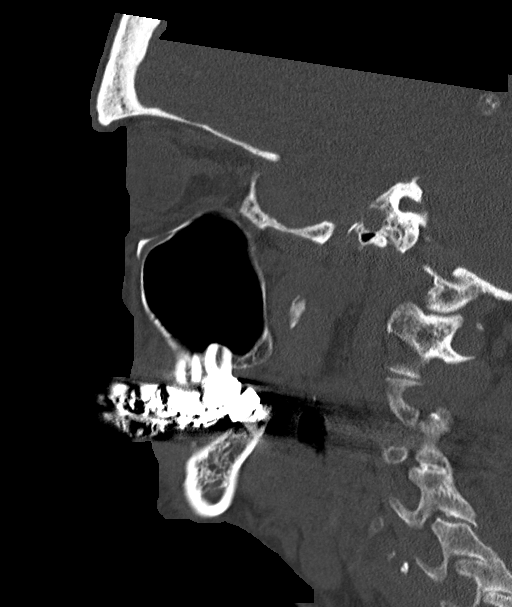

[15 of 47 positions shown; findings below may reference images not displayed]

FINDINGS: CT HEAD FINDINGS

Brain: No acute infarct or hemorrhage. Chronic left basal ganglia
lacunar infarct unchanged. Lateral ventricles and remaining midline
structures are unremarkable. No acute extra-axial fluid collections.
No mass effect.

Vascular: No hyperdense vessel or unexpected calcification.

Skull: Normal. Negative for fracture or focal lesion.

Other: None.

CT MAXILLOFACIAL FINDINGS

Osseous: No acute displaced fractures.

Orbits: Negative. No traumatic or inflammatory finding.

Sinuses: Clear.

Soft tissues: Soft tissue swelling overlying the nasal bridge.
Remaining soft tissues are normal.

CT CERVICAL SPINE FINDINGS

Alignment: Alignment is grossly anatomic.

Skull base and vertebrae: No acute displaced cervical spine
fractures.

Soft tissues and spinal canal: No prevertebral fluid or swelling. No
visible canal hematoma.

Disc levels: Likely partial congenital fusion from C2 through C4,
with bony fusion of the bilateral facet joints and relative
hypoplastic disc spaces. There is also bony fusion of the C5/C6
facet joints. There is diffuse disc space narrowing and osteophyte
formation most pronounced at C4/C5 with resulting neural foraminal
encroachment.

Upper chest: Airway is patent. Lung apices are clear.

Other: Reconstructed images demonstrate no additional findings.
IMPRESSION: CT HEAD:

1. No acute intracranial process.
2. Chronic left basal ganglia lacunar infarct.

CT MAXILLOFACIAL:

1. Soft tissue swelling overlying the nasal bridge.
2. No acute displaced facial bone fracture.

CT CERVICAL SPINE:

1. No acute cervical spine fracture.
2. Multilevel spondylosis and facet hypertrophy.

## 2022-10-06 ENCOUNTER — Ambulatory Visit (INDEPENDENT_AMBULATORY_CARE_PROVIDER_SITE_OTHER): Payer: Medicare Other | Admitting: Vascular Surgery

## 2022-10-06 ENCOUNTER — Ambulatory Visit (INDEPENDENT_AMBULATORY_CARE_PROVIDER_SITE_OTHER): Payer: Medicare Other

## 2022-10-06 ENCOUNTER — Other Ambulatory Visit: Payer: Self-pay | Admitting: Vascular Surgery

## 2022-10-06 ENCOUNTER — Encounter (INDEPENDENT_AMBULATORY_CARE_PROVIDER_SITE_OTHER): Payer: Self-pay | Admitting: Vascular Surgery

## 2022-10-06 VITALS — BP 110/64 | HR 60 | Resp 16 | Ht 73.0 in | Wt 176.2 lb

## 2022-10-06 DIAGNOSIS — I739 Peripheral vascular disease, unspecified: Secondary | ICD-10-CM

## 2022-10-06 DIAGNOSIS — M79606 Pain in leg, unspecified: Secondary | ICD-10-CM | POA: Insufficient documentation

## 2022-10-06 DIAGNOSIS — I10 Essential (primary) hypertension: Secondary | ICD-10-CM

## 2022-10-06 DIAGNOSIS — M79604 Pain in right leg: Secondary | ICD-10-CM | POA: Diagnosis not present

## 2022-10-06 DIAGNOSIS — M79605 Pain in left leg: Secondary | ICD-10-CM

## 2022-10-06 DIAGNOSIS — I8311 Varicose veins of right lower extremity with inflammation: Secondary | ICD-10-CM | POA: Diagnosis not present

## 2022-10-06 DIAGNOSIS — N182 Chronic kidney disease, stage 2 (mild): Secondary | ICD-10-CM | POA: Diagnosis not present

## 2022-10-06 DIAGNOSIS — I8312 Varicose veins of left lower extremity with inflammation: Secondary | ICD-10-CM

## 2022-10-06 DIAGNOSIS — I70219 Atherosclerosis of native arteries of extremities with intermittent claudication, unspecified extremity: Secondary | ICD-10-CM | POA: Insufficient documentation

## 2022-10-06 NOTE — Progress Notes (Signed)
MRN : [id number]  Melvin Ferguson is a 89 y.o. (06-07-1931) male who presents with chief complaint of check circulation.  History of Present Illness:   The patient is seen for evaluation of painful lower extremities. Patient notes the pain is variable and not always associated with activity.  The pain is somewhat consistent day to day occurring on most days. The patient notes the pain primarily occurs with standing and routinely seems worse in the morning which. The pain has been progressive over the past several years. The patient states these symptoms are causing  a negative impact on quality of life and daily activities which was a factor in the referral.  The patient has a  history of back problems and DJD of the lumbar and sacral spine.   The patient denies rest pain or dangling of an extremity off the side of the bed during the night for relief. No open wounds or sores at this time. No history of DVT or phlebitis. No prior vascular interventions or surgeries.   ABIs obtained today are normal bilaterally  MRI dated July 30, 2021 is reviewed with the patient and his wife.  Multiple elements of LS spine disease are identified.  Current Meds  Medication Sig   acetaminophen (TYLENOL) 500 MG tablet Take by mouth.   bismuth subsalicylate (PEPTO BISMOL) 262 MG chewable tablet Chew by mouth as needed.   Calcium Citrate-Vitamin D (CITRACAL PETITES/VITAMIN D) 200-250 MG-UNIT TABS Take by mouth.   lisinopril (PRINIVIL,ZESTRIL) 30 MG tablet Take 1 tablet by mouth daily.   Magnesium 250 MG TABS Take 1 tablet by mouth daily.   Multiple Vitamins-Minerals (CENTRUM SILVER ULTRA MENS PO) Take by mouth.   mupirocin ointment (BACTROBAN) 2 % Apply topically.   Probiotic Product (CULTURELLE PROBIOTICS PO) Take by mouth daily.   psyllium (METAMUCIL) 58.6 % packet Take 1 packet by mouth daily.   TRUE METRIX BLOOD GLUCOSE TEST test  strip daily. for testing as directed    Past Medical History:  Diagnosis Date   Anemia    Bradycardia    Cancer (HCC)    skin   Dental bridge present    Permanent Bottom Right   Diabetes mellitus without complication (HCC)    diet controlled   Diverticulitis    osis   Heart murmur    History of kidney stones    HOH (hard of hearing)    Hypertension    Kidney stones    Presence of permanent cardiac pacemaker 02/25/2017   [employer] Azure XT DR [id number]   Prostate disorder    Transfusion history     Past Surgical History:  Procedure Laterality Date   CATARACT EXTRACTION W/PHACO Left 08/10/2019   Procedure: CATARACT EXTRACTION PHACO AND INTRAOCULAR LENS PLACEMENT (IOC) LEFT DIABETIC 9.74  01:16.4  12.7% ;  Surgeon: Lockie Mola, MD;  Location: [facility];  Service: Ophthalmology;  Laterality: Left;  Diabetic - diet controlled   CATARACT EXTRACTION W/PHACO Right 08/31/2019   Procedure: CATARACT EXTRACTION PHACO AND INTRAOCULAR LENS PLACEMENT (IOC) RIGHT DIABETIC;  Surgeon: Lockie Mola, MD;  Location: [facility];  Service: Ophthalmology;  Laterality: Right;  10.94 1:03.4 17.3%   COLONOSCOPY     HEMORROIDECTOMY     HERNIA REPAIR     PACEMAKER INSERTION N/A 02/25/2017   Procedure: INSERTION PACEMAKER;  Surgeon: Marcina Millard, MD;  Location: [facility] OR[facility];  Service: Cardiovascular;  Laterality: N/A;   penile resection     PILONIDAL CYST EXCISION     PROSTATE SURGERY      Social History Social History   Tobacco Use   Smoking status: Never   Smokeless tobacco: Never  Vaping Use   Vaping Use: Never used  Substance Use Topics   Alcohol use: No   Drug use: No    Family History Family History  Problem Relation Age of Onset   Colon cancer Mother    Parkinson's disease Father     No Known Allergies   REVIEW OF SYSTEMS (Negative unless checked)  Constitutional: [] Weight loss  [] Fever  [] Chills Cardiac: [] Chest pain   [] Chest  pressure   [] Palpitations   [] Shortness of breath when laying flat   [] Shortness of breath with exertion. Vascular:  [x] Pain in legs with walking   [] Pain in legs at rest  [] History of DVT   [] Phlebitis   [] Swelling in legs   [] Varicose veins   [] Non-healing ulcers Pulmonary:   [] Uses home oxygen   [] Productive cough   [] Hemoptysis   [] Wheeze  [] COPD   [] Asthma Neurologic:  [] Dizziness   [] Seizures   [] History of stroke   [] History of TIA  [] Aphasia   [] Vissual changes   [] Weakness or numbness in arm   [] Weakness or numbness in leg Musculoskeletal:   [] Joint swelling   [] Joint pain   [x] Low back pain Hematologic:  [] Easy bruising  [] Easy bleeding   [] Hypercoagulable state   [] Anemic Gastrointestinal:  [] Diarrhea   [] Vomiting  [] Gastroesophageal reflux/heartburn   [] Difficulty swallowing. Genitourinary:  [] Chronic kidney disease   [] Difficult urination  [] Frequent urination   [] Blood in urine Skin:  [] Rashes   [] Ulcers  Psychological:  [] History of anxiety   []  History of major depression.  Physical Examination  Vitals:   10/06/22 0957  BP: 110/64  Pulse: 60  Resp: 16  Weight: 176 lb 3.2 oz (79.9 kg)  Height: 6\' 1"  (1.854 m)   Body mass index is 23.25 kg/m. Gen: WD/WN, NAD Head: [place]/AT, No temporalis wasting.  Ear/Nose/Throat: Hearing grossly intact, nares w/o erythema or drainage Eyes: PER, EOMI, sclera nonicteric.  Neck: Supple, no masses.  No bruit or JVD.  Pulmonary:  Good air movement, no audible wheezing, no use of accessory muscles.  Cardiac: RRR, normal S1, S2, no Murmurs. Vascular: Large varicosities right much greater than left.  On the right side there are 12 to 15 mm verrucous is identified.  They are mildly tender to palpation. Vessel Right Left  Radial Palpable Palpable  PT Not Palpable Not Palpable  DP Not Palpable Not Palpable  Gastrointestinal: soft, non-distended. No guarding/no peritoneal signs.  Musculoskeletal: M/S 5/5 throughout.  No visible deformity.   Neurologic: CN 2-12 intact. Pain and light touch intact in extremities.  Symmetrical.  Speech is fluent. Motor exam as listed above. Psychiatric: Judgment intact, Mood & affect appropriate for pt's clinical situation. Dermatologic: No rashes or ulcers noted.  No changes consistent with cellulitis.   CBC Lab Results  Component Value Date   WBC 5.1 10/30/2019   HGB 11.1 (L) 10/30/2019   HCT 31.9 (L) 10/30/2019   MCV 91.9 10/30/2019   PLT 135 (L)  10/30/2019    BMET    Component Value Date/Time   NA 130 (L) 10/30/2019 1258   K 3.6 10/30/2019 1258   CL 101 10/30/2019 1258   CO2 21 (L) 10/30/2019 1258   GLUCOSE 101 (H) 10/30/2019 1258   BUN 24 (H) 10/30/2019 1258   CREATININE 0.93 10/30/2019 1258   CALCIUM 8.1 (L) 10/30/2019 1258   GFRNONAA >60 10/30/2019 1258   GFRAA >60 10/30/2019 1258   CrCl cannot be calculated (Patient's most recent lab result is older than the maximum 21 days allowed.).  COAG Lab Results  Component Value Date   INR 1.06 02/19/2017    Radiology No results found.   Assessment/Plan 1. Pain in both lower extremities Recommend:  The patient has atypical pain symptoms for vascular disease and on exam I do not find evidence of vascular pathology that would explain the patient's symptoms.  Noninvasive studies do not identify significant vascular problems  I suspect the patient is c/o pseudoclaudication.  Patient should have an evaluation of the LS spine which I defer to the primary service or the Spine service.  The patient should continue walking and begin a more formal exercise program. The patient should continue his antiplatelet therapy and aggressive treatment of the lipid abnormalities.  Patient will follow-up with me on a PRN basis.  2. Varicose veins of both lower extremities with inflammation I did discuss treatments of his varicose vein with him.  I do not believe that this is the etiology of his lower extremity symptoms.  However wearing a  compression would be of benefit.  I have furnished information regarding compression socks such as [employer] and [email or web address].  3. Essential hypertension Continue antihypertensive medications as already ordered, these medications have been reviewed and there are no changes at this time.  4. Chronic kidney disease, stage II (mild) The patient has mild renal disease.  However, at the present time the patient is not yet on dialysis.  Avoid nephrotoxic medications and dehydration.  Further plans per nephrology    Levora Dredge, MD  10/06/2022 10:08 AM

## 2022-10-09 LAB — VAS US ABI WITH/WO TBI
Left ABI: 1.19
Right ABI: 1.15

## 2023-04-27 ENCOUNTER — Emergency Department
Admission: EM | Admit: 2023-04-27 | Discharge: 2023-04-27 | Disposition: A | Discharge status: Home / Self Care | Payer: Medicare Other | Attending: Emergency Medicine | Admitting: Emergency Medicine

## 2023-04-27 ENCOUNTER — Emergency Department: Payer: Medicare Other

## 2023-04-27 ENCOUNTER — Other Ambulatory Visit: Payer: Self-pay

## 2023-04-27 DIAGNOSIS — R2681 Unsteadiness on feet: Secondary | ICD-10-CM | POA: Insufficient documentation

## 2023-04-27 DIAGNOSIS — Y92012 Bathroom of single-family (private) house as the place of occurrence of the external cause: Secondary | ICD-10-CM | POA: Insufficient documentation

## 2023-04-27 DIAGNOSIS — W19XXXA Unspecified fall, initial encounter: Secondary | ICD-10-CM | POA: Diagnosis not present

## 2023-04-27 DIAGNOSIS — E1122 Type 2 diabetes mellitus with diabetic chronic kidney disease: Secondary | ICD-10-CM | POA: Diagnosis not present

## 2023-04-27 DIAGNOSIS — N189 Chronic kidney disease, unspecified: Secondary | ICD-10-CM | POA: Diagnosis not present

## 2023-04-27 DIAGNOSIS — I129 Hypertensive chronic kidney disease with stage 1 through stage 4 chronic kidney disease, or unspecified chronic kidney disease: Secondary | ICD-10-CM | POA: Diagnosis not present

## 2023-04-27 LAB — URINALYSIS, ROUTINE W REFLEX MICROSCOPIC
Bacteria, UA: NONE SEEN
Bilirubin Urine: NEGATIVE
Glucose, UA: NEGATIVE mg/dL
Hgb urine dipstick: NEGATIVE
Ketones, ur: NEGATIVE mg/dL
Nitrite: NEGATIVE
Protein, ur: NEGATIVE mg/dL
Specific Gravity, Urine: 1.011 (ref 1.005–1.030)
pH: 7 (ref 5.0–8.0)

## 2023-04-27 LAB — CBC
HCT: 37 % — ABNORMAL LOW (ref 39.0–52.0)
Hemoglobin: 12.3 g/dL — ABNORMAL LOW (ref 13.0–17.0)
MCH: 32.9 pg (ref 26.0–34.0)
MCHC: 33.2 g/dL (ref 30.0–36.0)
MCV: 98.9 fL (ref 80.0–100.0)
Platelets: 207 10*3/uL (ref 150–400)
RBC: 3.74 MIL/uL — ABNORMAL LOW (ref 4.22–5.81)
RDW: 12.9 % (ref 11.5–15.5)
WBC: 5.5 10*3/uL (ref 4.0–10.5)
nRBC: 0 % (ref 0.0–0.2)

## 2023-04-27 LAB — BASIC METABOLIC PANEL
Anion gap: 13 (ref 5–15)
BUN: 21 mg/dL (ref 8–23)
CO2: 23 mmol/L (ref 22–32)
Calcium: 9.2 mg/dL (ref 8.9–10.3)
Chloride: 103 mmol/L (ref 98–111)
Creatinine, Ser: 0.62 mg/dL (ref 0.61–1.24)
GFR, Estimated: >60 mL/min (ref >60)
Glucose, Bld: 136 mg/dL — ABNORMAL HIGH (ref 70–99)
Potassium: 4.1 mmol/L (ref 3.5–5.1)
Sodium: 139 mmol/L (ref 135–145)

## 2023-04-27 LAB — TROPONIN I (HIGH SENSITIVITY): Troponin I (High Sensitivity): 11 ng/L (ref <18)

## 2023-04-27 NOTE — ED Notes (Signed)
Pt given water, crackers, peanut butter and apple sauce.

## 2023-04-27 NOTE — ED Notes (Signed)
Left message for RN at [facility] to inform of pt's return back.

## 2023-04-27 NOTE — ED Provider Notes (Signed)
[facility] Provider Note    Event Date/Time   First MD Initiated Contact with Patient 04/27/23 1254     (approximate)   History   Chief Complaint Fall and Gait Problem   HPI  Melvin Ferguson is a 89 y.o. male with past medical history of hypertension, diabetes, CKD, and sick sinus syndrome status post pacemaker who presents to the ED complaining of fall.  Patient reports that he was going to the bathroom earlier this morning while wearing his robe and using his walker.  He attempted to remove his robe after entering the bathroom, at which point he lost his balance and fell backwards, landing in the bottom of the shower.  He reports hitting his head but denies losing consciousness and does not take any blood thinners.  He was evaluated by medics at that time, who recommended he come to the ED for evaluation.  He denies significant headache or neck pain, denies any injuries to his trunk or extremities.  He does state he has had issues with unsteady gait for the past few weeks, has been working with physical therapy for this problem.     Physical Exam   Triage Vital Signs: ED Triage Vitals  Encounter Vitals Group     BP 04/27/23 0824 (!) 159/77     Systolic BP Percentile --      Diastolic BP Percentile --      Pulse Rate 04/27/23 0824 (!) 57     Resp 04/27/23 0824 19     Temp 04/27/23 0824 98.4 F (36.9 C)     Temp Source 04/27/23 0824 Oral     SpO2 04/27/23 0824 97 %     Weight 04/27/23 0826 180 lb (81.6 kg)     Height 04/27/23 0826 6\' 1"  (1.854 m)     Head Circumference --      Peak Flow --      Pain Score 04/27/23 0825 0     Pain Loc --      Pain Education --      Exclude from Growth Chart --     Most recent vital signs: Vitals:   04/27/23 0824  BP: (!) 159/77  Pulse: (!) 57  Resp: 19  Temp: 98.4 F (36.9 C)  SpO2: 97%    Constitutional: Alert and oriented. Eyes: Conjunctivae are normal. Head: Atraumatic. Nose: No  congestion/rhinnorhea. Mouth/Throat: Mucous membranes are moist.  Neck: No midline cervical spine tenderness to palpation. Cardiovascular: Normal rate, regular rhythm. Grossly normal heart sounds.  2+ radial pulses bilaterally. Respiratory: Normal respiratory effort.  No retractions. Lungs CTAB.  No chest wall tenderness to palpation. Gastrointestinal: Soft and nontender. No distention. Musculoskeletal: No lower extremity tenderness nor edema.  No upper extremity bony tenderness to palpation.  No midline thoracic or lumbar spinal tenderness. Neurologic:  Normal speech and language. No gross focal neurologic deficits are appreciated.    ED Results / Procedures / Treatments   Labs (all labs ordered are listed, but only abnormal results are displayed) Labs Reviewed  BASIC METABOLIC PANEL - Abnormal; Notable for the following components:      Result Value   Glucose, Bld 136 (*)    All other components within normal limits  CBC - Abnormal; Notable for the following components:   RBC 3.74 (*)    Hemoglobin 12.3 (*)    HCT 37.0 (*)    All other components within normal limits  URINALYSIS, ROUTINE W REFLEX MICROSCOPIC - Abnormal; Notable  for the following components:   Color, Urine YELLOW (*)    APPearance CLEAR (*)    Leukocytes,Ua TRACE (*)    All other components within normal limits  CBG MONITORING, ED  TROPONIN I (HIGH SENSITIVITY)     EKG  ED ECG REPORT I, Chesley Noon, the attending physician, personally viewed and interpreted this ECG. 8:34  Date: 04/27/2023  EKG Time: 8:34  Rate: 60  Rhythm: AV dual paced rhythm  Axis: Normal  Intervals:nonspecific intraventricular conduction delay  ST&T Change: None  RADIOLOGY CT head reviewed and interpreted by me with no infiltrate, edema, or effusion.  PROCEDURES:  Critical Care performed: No  Procedures   MEDICATIONS ORDERED IN ED: Medications - No data to display   IMPRESSION / MDM / ASSESSMENT AND PLAN / ED  COURSE  I reviewed the triage vital signs and the nursing notes.                              89 y.o. male with past medical history of hypertension, diabetes, CKD, and sick sinus syndrome status post pacemaker who presents to the ED after losing his balance and falling backwards into the shower at his nursing facility.  Patient's presentation is most consistent with acute presentation with potential threat to life or bodily function.  Differential diagnosis includes, but is not limited to, intracranial injury, cervical spine injury, extremity injury, anemia, electrolyte abnormality, AKI, UTI.  Patient nontoxic-appearing and in no acute distress, vital signs are unremarkable.  CT head and cervical spine performed from triage and are negative for acute process.  No evidence of traumatic injury to his trunk or extremities.  EKG shows AV dual paced rhythm, labs are reassuring with no significant anemia, leukocytosis, electrolyte abnormality, or AKI.  Urinalysis with no signs of infection, patient able to ambulate with the assistance of a walker without difficulty here in the ED.  He is appropriate for discharge back to his nursing facility, patient and family agree with plan.      FINAL CLINICAL IMPRESSION(S) / ED DIAGNOSES   Final diagnoses:  Fall, initial encounter  Unsteady gait     Rx / DC Orders   ED Discharge Orders     None        Note:  This document was prepared using Dragon voice recognition software and may include unintentional dictation errors.   Chesley Noon, MD 04/27/23 1356

## 2023-04-27 NOTE — ED Triage Notes (Addendum)
Pt presents to ED via [facility] from [facility] with c/o of falling while undoing his robe, pt states he felt unsteady, pt sues a walker at home. Pt states weakness to both legs.   Pt states he fell backwards and hit the bathtub, pt denies blood thinner, pt denies LOC.   Pt states taking PT for weakness in legs.   Pt assisted in triage to use restroom to urinate but pt could not go at this time.

## 2023-04-27 NOTE — ED Notes (Signed)
Pt was able to walk with walker with stand by assistance by this writer and Heidy, NT. Pt tolerated well.

## 2023-04-27 NOTE — ED Provider Triage Note (Signed)
Emergency Medicine Provider Triage Evaluation Note  Melvin Ferguson , a 89 y.o. male  was evaluated in triage.  Pt complains of fall this am. Was in bathroom when he felt weak and fell. Hit his head, No LOC.  Review of Systems  Positive: +hit head, weak Negative: No pain, No vision changes.  No lacerations  Physical Exam  BP (!) 159/77 (BP Location: Right Arm)   Pulse (!) 57   Temp 98.4 F (36.9 C) (Oral)   Resp 19   Ht 6\' 1"  (1.854 m)   Wt 81.6 kg   SpO2 97%   BMI 23.75 kg/m  Gen:   Awake, no distress    answers questions. Resp:  Normal effort   Lungs clear MSK:   Moves extremities without difficulty  Other:    Medical Decision Making  Medically screening exam initiated at 8:27 AM.  Appropriate orders placed.  Melvin Ferguson was informed that the remainder of the evaluation will be completed by another provider, this initial triage assessment does not replace that evaluation, and the importance of remaining in the ED until their evaluation is complete.     Tommi Rumps, PA-C 04/27/23 [phone]

## 2024-01-10 ENCOUNTER — Other Ambulatory Visit: Payer: Self-pay

## 2024-01-10 ENCOUNTER — Emergency Department
Admission: EM | Admit: 2024-01-10 | Discharge: 2024-01-12 | Disposition: A | Discharge status: Home / Self Care | Attending: Emergency Medicine | Admitting: Emergency Medicine

## 2024-01-10 DIAGNOSIS — Z7982 Long term (current) use of aspirin: Secondary | ICD-10-CM | POA: Insufficient documentation

## 2024-01-10 DIAGNOSIS — Z85828 Personal history of other malignant neoplasm of skin: Secondary | ICD-10-CM | POA: Insufficient documentation

## 2024-01-10 DIAGNOSIS — Z79899 Other long term (current) drug therapy: Secondary | ICD-10-CM | POA: Insufficient documentation

## 2024-01-10 DIAGNOSIS — R531 Weakness: Secondary | ICD-10-CM | POA: Insufficient documentation

## 2024-01-10 DIAGNOSIS — R262 Difficulty in walking, not elsewhere classified: Secondary | ICD-10-CM | POA: Diagnosis present

## 2024-01-10 DIAGNOSIS — Z950 Presence of cardiac pacemaker: Secondary | ICD-10-CM | POA: Insufficient documentation

## 2024-01-10 DIAGNOSIS — I129 Hypertensive chronic kidney disease with stage 1 through stage 4 chronic kidney disease, or unspecified chronic kidney disease: Secondary | ICD-10-CM | POA: Diagnosis not present

## 2024-01-10 DIAGNOSIS — N182 Chronic kidney disease, stage 2 (mild): Secondary | ICD-10-CM | POA: Insufficient documentation

## 2024-01-10 LAB — URINALYSIS, ROUTINE W REFLEX MICROSCOPIC
Bacteria, UA: NONE SEEN
Bilirubin Urine: NEGATIVE
Glucose, UA: NEGATIVE mg/dL
Ketones, ur: NEGATIVE mg/dL
Leukocytes,Ua: NEGATIVE
Nitrite: NEGATIVE
Protein, ur: 30 mg/dL — AB
RBC / HPF: >50 RBC/hpf (ref 0–5)
Specific Gravity, Urine: 1.012 (ref 1.005–1.030)
WBC, UA: >50 WBC/hpf (ref 0–5)
pH: 8 (ref 5.0–8.0)

## 2024-01-10 LAB — COMPREHENSIVE METABOLIC PANEL WITH GFR
ALT: 20 U/L (ref 0–44)
AST: 27 U/L (ref 15–41)
Albumin: 4 g/dL (ref 3.5–5.0)
Alkaline Phosphatase: 82 U/L (ref 38–126)
Anion gap: 9 (ref 5–15)
BUN: 26 mg/dL — ABNORMAL HIGH (ref 8–23)
CO2: 25 mmol/L (ref 22–32)
Calcium: 8.9 mg/dL (ref 8.9–10.3)
Chloride: 105 mmol/L (ref 98–111)
Creatinine, Ser: 0.59 mg/dL — ABNORMAL LOW (ref 0.61–1.24)
GFR, Estimated: >60 mL/min (ref >60)
Glucose, Bld: 128 mg/dL — ABNORMAL HIGH (ref 70–99)
Potassium: 4 mmol/L (ref 3.5–5.1)
Sodium: 139 mmol/L (ref 135–145)
Total Bilirubin: 0.8 mg/dL (ref 0.0–1.2)
Total Protein: 6.9 g/dL (ref 6.5–8.1)

## 2024-01-10 LAB — CBC WITH DIFFERENTIAL/PLATELET
Abs Immature Granulocytes: 0.01 K/uL (ref 0.00–0.07)
Basophils Absolute: 0 K/uL (ref 0.0–0.1)
Basophils Relative: 1 %
Eosinophils Absolute: 0.1 K/uL (ref 0.0–0.5)
Eosinophils Relative: 1 %
HCT: 38.6 % — ABNORMAL LOW (ref 39.0–52.0)
Hemoglobin: 12.7 g/dL — ABNORMAL LOW (ref 13.0–17.0)
Immature Granulocytes: 0 %
Lymphocytes Relative: 25 %
Lymphs Abs: 1.6 K/uL (ref 0.7–4.0)
MCH: 32.1 pg (ref 26.0–34.0)
MCHC: 32.9 g/dL (ref 30.0–36.0)
MCV: 97.5 fL (ref 80.0–100.0)
Monocytes Absolute: 0.9 K/uL (ref 0.1–1.0)
Monocytes Relative: 14 %
Neutro Abs: 4 K/uL (ref 1.7–7.7)
Neutrophils Relative %: 59 %
Platelets: 198 K/uL (ref 150–400)
RBC: 3.96 MIL/uL — ABNORMAL LOW (ref 4.22–5.81)
RDW: 12.9 % (ref 11.5–15.5)
Smear Review: NORMAL
WBC: 6.6 K/uL (ref 4.0–10.5)
nRBC: 0 % (ref 0.0–0.2)

## 2024-01-10 LAB — CBG MONITORING, ED: Glucose-Capillary: 126 mg/dL — ABNORMAL HIGH (ref 70–99)

## 2024-01-10 LAB — TROPONIN I (HIGH SENSITIVITY): Troponin I (High Sensitivity): 14 ng/L (ref <18)

## 2024-01-10 MED ORDER — CEFTRIAXONE SODIUM 1 G IJ SOLR
1.0000 g | Freq: Once | INTRAMUSCULAR | Status: AC
  Administered 2024-01-10: 1 g via INTRAVENOUS
  Filled 2024-01-10: qty 10

## 2024-01-10 MED ORDER — SODIUM CHLORIDE 0.9 % IV BOLUS
500.0000 mL | Freq: Once | INTRAVENOUS | Status: AC
  Administered 2024-01-10: 500 mL via INTRAVENOUS

## 2024-01-10 NOTE — ED Notes (Signed)
 Pt sitting on toilet in room for BM. Wife at bedside and call bell in reach. Pt will use call bell when finished.

## 2024-01-10 NOTE — ED Triage Notes (Signed)
 pt coming in from [facility]. right leg and foot weakness hx neuropathy. pt normally abel to walk around with walker but hasn't been able to today. he couldn't get up to go to church like he normally does. AOX4  120/60, 66,  CBG:  174

## 2024-01-10 NOTE — ED Notes (Signed)
 In to insert foley and found pt to have a saturated brief. EDP made aware and stated verbally to in and out vs foley. In and out performed with Domenica NT at beside. PT 700 cc had red urine obtained. Pt denies any pain or discomfort. Denies any prostate issues. Verbalized some relief after urine. Dry brief applied and external catheter put on pt and hooked to suction.

## 2024-01-10 NOTE — ED Provider Notes (Signed)
 [facility] Provider Note    Event Date/Time   First MD Initiated Contact with Patient 01/10/24 1504     (approximate)   History   Extremity Weakness   HPI  TJ KITCHINGS is a 89 y.o. male who presents to the emergency department today because of concerns for lower extremity weakness.  The patient has history of neuropathy so does typically use a walker to get around.  Today however after getting up he was extremely weak in his legs.  He was unable to ambulate with his walker and could not afford much assistance when family members tried to help him get up.  Patient is a little bit weaker on the right leg which she says is normal for him.  Patient denies any fevers or chills recently.  Denies any nausea or vomiting.  Has not noticed any change in urination recently. Currently resides in independent living with wife.      Physical Exam   Triage Vital Signs: ED Triage Vitals  Encounter Vitals Group     BP 01/10/24 1505 135/71     Girls Systolic BP Percentile --      Girls Diastolic BP Percentile --      Boys Systolic BP Percentile --      Boys Diastolic BP Percentile --      Pulse Rate 01/10/24 1501 60     Resp 01/10/24 1501 20     Temp 01/10/24 1505 97.9 F (36.6 C)     Temp Source 01/10/24 1505 Oral     SpO2 01/10/24 1501 99 %     Weight 01/10/24 1502 178 lb (80.7 kg)     Height 01/10/24 1502 6' 1 (1.854 m)     Head Circumference --      Peak Flow --      Pain Score 01/10/24 1502 0     Pain Loc --      Pain Education --      Exclude from Growth Chart --     Most recent vital signs: Vitals:   01/10/24 1501 01/10/24 1505  BP:  135/71  Pulse: 60   Resp: 20   Temp:  97.9 F (36.6 C)  SpO2: 99%    General: Awake, alert, oriented. CV:  Good peripheral perfusion. Regular rate and rhythm. Resp:  Normal effort. Lungs clear. Abd:  No distention.    ED Results / Procedures / Treatments   Labs (all labs ordered are listed, but only  abnormal results are displayed) Labs Reviewed  CBC WITH DIFFERENTIAL/PLATELET - Abnormal; Notable for the following components:      Result Value   RBC 3.96 (*)    Hemoglobin 12.7 (*)    HCT 38.6 (*)    All other components within normal limits  COMPREHENSIVE METABOLIC PANEL WITH GFR - Abnormal; Notable for the following components:   Glucose, Bld 128 (*)    BUN 26 (*)    Creatinine, Ser 0.59 (*)    All other components within normal limits  URINALYSIS, ROUTINE W REFLEX MICROSCOPIC - Abnormal; Notable for the following components:   Color, Urine RED (*)    APPearance HAZY (*)    Hgb urine dipstick LARGE (*)    Protein, ur 30 (*)    All other components within normal limits  CBG MONITORING, ED - Abnormal; Notable for the following components:   Glucose-Capillary 126 (*)    All other components within normal limits  URINE CULTURE  TROPONIN I (  HIGH SENSITIVITY)     EKG  I, Guadalupe Eagles, attending physician, personally viewed and interpreted this EKG  EKG Time: 1505 Rate: 60 Rhythm: sinus rhythm with PAC Axis: left axis deviation Intervals: qtc 490 QRS: LBBB ST changes: no st elevation Impression: abnormal ekg   RADIOLOGY None   PROCEDURES:  Critical Care performed: No   MEDICATIONS ORDERED IN ED: Medications - No data to display   IMPRESSION / MDM / ASSESSMENT AND PLAN / ED COURSE  I reviewed the triage vital signs and the nursing notes.                              Differential diagnosis includes, but is not limited to, infection, anemia, dehydration, CVA  Patient's presentation is most consistent with acute presentation with potential threat to life or bodily function.  Patient presented to the emergency department today because concerns for lower extremity weakness and difficulty with ambulation.  Patient with history of neuropathy.  Patient was afebrile here in the emergency department.  Patient was not hypotensive.  Blood work without concerning  anemia or electrolyte abnormality.  Will check UA for possible infection.  Will give IV fluids.  UA without any leukocytes or bacteria.  Negative nitrite.  Does show whites and red blood cells however unclear if it was secondary to traumatic catheterization.  Patient did feel slightly improved after fluids, I do wonder if it is dehydration.  However at this time patient family still not comfortable with patient being discharged back to independent living.  Will have social work evaluate in the morning.      FINAL CLINICAL IMPRESSION(S) / ED DIAGNOSES   Weakness       Note:  This document was prepared using Dragon voice recognition software and may include unintentional dictation errors.    Eagles Guadalupe, MD 01/10/24 2049

## 2024-01-10 NOTE — ED Notes (Signed)
 PT ambulated with roll aider in room. Pt shuffled feet and stated he felt some better but still a little unsteady and unsure about going home.

## 2024-01-11 DIAGNOSIS — R262 Difficulty in walking, not elsewhere classified: Secondary | ICD-10-CM | POA: Diagnosis not present

## 2024-01-11 LAB — URINE CULTURE: Culture: NO GROWTH

## 2024-01-11 MED ORDER — GABAPENTIN 100 MG PO CAPS
100.0000 mg | ORAL_CAPSULE | Freq: Every day | ORAL | Status: DC
  Administered 2024-01-12: 100 mg via ORAL
  Filled 2024-01-11: qty 1

## 2024-01-11 MED ORDER — GABAPENTIN 100 MG PO CAPS
100.0000 mg | ORAL_CAPSULE | Freq: Two times a day (BID) | ORAL | Status: DC
  Administered 2024-01-11: 100 mg via ORAL
  Filled 2024-01-11: qty 1

## 2024-01-11 MED ORDER — GABAPENTIN 300 MG PO CAPS: 300.0000 mg | ORAL_CAPSULE | Freq: Every day | ORAL | Status: DC

## 2024-01-11 NOTE — [place] FL2 (Signed)
 [place]  MEDICAID FL2 LEVEL OF CARE FORM     IDENTIFICATION  Patient Name: Melvin Ferguson Birthdate: [birth year] Sex: male Admission Date (Current Location): 01/10/2024  [facility] and [place] Number:  Chiropodist and Address:  [facility], [address], [place], [place] [address]      Provider Number: [id number]  Attending Physician Name and Address:  Dicky Anes, MD  Relative Name and Phone Number:  Levorn Rosenberger(Wife)  [phone]    Current Level of Care: Hospital Recommended Level of Care: Skilled Nursing Facility Prior Approval Number:    Date Approved/Denied:   PASRR Number: [id number]  Discharge Plan: Home    Current Diagnoses: Patient Active Problem List   Diagnosis Date Noted   Atherosclerosis of native arteries of extremity with intermittent claudication (HCC) 10/06/2022   Leg pain 10/06/2022   Varicose veins of both lower extremities with inflammation 10/06/2022   Borderline diabetes 08/24/2019   Essential hypertension 08/24/2019   Lung nodule 08/24/2019   Normocytic anemia 08/24/2019   History of nephrolithiasis 06/30/2019   LV dysfunction 06/30/2018   Encounter for general adult medical examination without abnormal findings 12/29/2017   Status post placement of cardiac pacemaker 12/29/2017   Sick sinus syndrome (HCC) 02/25/2017   Incomplete emptying of bladder 02/01/2015   Low back pain 05/31/2012   Renal colic 05/31/2012   Benign localized prostatic hyperplasia with lower urinary tract symptoms (LUTS) 02/09/2012   Chronic kidney disease, stage II (mild) 02/09/2012   Kidney stone 02/09/2012   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   Basal cell carcinoma of face 10/06/2011    Orientation RESPIRATION BLADDER Height & Weight     Self, Time, Situation, Place  Normal Incontinent Weight: 80.7 kg Height:  6' 1 (185.4 cm)  BEHAVIORAL SYMPTOMS/MOOD NEUROLOGICAL BOWEL NUTRITION STATUS        Diet  (Diet regular with thin liquids)  AMBULATORY STATUS COMMUNICATION OF NEEDS Skin   Extensive Assist Verbally Normal (Bedside RN informed of redness on buttocks)                       Personal Care Assistance Level of Assistance  Bathing, Feeding, Dressing Bathing Assistance: Limited assistance Feeding assistance: Independent Dressing Assistance: Limited assistance     Functional Limitations Info  Sight (Wears Glasses) Sight Info: Impaired        SPECIAL CARE FACTORS FREQUENCY  PT (By licensed PT), OT (By licensed OT)     PT Frequency: 5x a wk OT Frequency: 5x a wk            Contractures Contractures Info: Not present    Additional Factors Info  Code Status               Current Medications (01/11/2024):  This is the current hospital active medication list Current Facility-Administered Medications  Medication Dose Route Frequency Provider Last Rate Last Admin   gabapentin  (NEURONTIN ) capsule 100 mg  100 mg Oral BID Dicky Anes, MD       Current Outpatient Medications  Medication Sig Dispense Refill   gabapentin  (NEURONTIN ) 100 MG capsule Take 100 mg by mouth 2 (two) times daily. TAKE ONE CAPSULE BY MOUTH IN THE MORNING AND three CAPSULES AT NIGHT FOR NEUROPATHY     lisinopril  (PRINIVIL ,ZESTRIL ) 30 MG tablet Take 1 tablet by mouth daily.     psyllium (METAMUCIL) 58.6 % packet Take 1 packet by mouth daily.     acetaminophen  (TYLENOL ) 500  MG tablet Take by mouth. (Patient not taking: Reported on 01/10/2024)     aspirin 81 MG EC tablet Take by mouth. (Patient not taking: Reported on 10/06/2022)     bismuth subsalicylate (PEPTO BISMOL) 262 MG chewable tablet Chew by mouth as needed. (Patient not taking: Reported on 01/10/2024)     Calcium Citrate-Vitamin D (CITRACAL PETITES/VITAMIN D) 200-250 MG-UNIT TABS Take by mouth. (Patient not taking: Reported on 01/10/2024)     hydrocortisone cream 1 % Apply topically 2 (two) times daily. (Patient not taking: Reported on  10/06/2022)     Magnesium 250 MG TABS Take 1 tablet by mouth daily. (Patient not taking: Reported on 01/10/2024)     Multiple Vitamins-Minerals (CENTRUM SILVER ULTRA MENS PO) Take by mouth. (Patient not taking: Reported on 01/10/2024)     mupirocin ointment (BACTROBAN) 2 % Apply topically. (Patient not taking: Reported on 01/10/2024)     naproxen sodium (ALEVE) 220 MG tablet Take by mouth. (Patient not taking: Reported on 10/06/2022)     ondansetron  (ZOFRAN -ODT) 4 MG disintegrating tablet Take 1 tablet (4 mg total) by mouth every 8 (eight) hours as needed for nausea or vomiting. (Patient not taking: Reported on 10/06/2022) 20 tablet 0   Probiotic Product (CULTURELLE PROBIOTICS PO) Take by mouth daily. (Patient not taking: Reported on 01/10/2024)     triamcinolone cream (KENALOG) 0.1 % APPLY TOPICALLY TO RASH TWICE DAILY UNTIL GONE (Patient not taking: Reported on 10/06/2022)     TRUE METRIX BLOOD GLUCOSE TEST test strip daily. for testing as directed       Discharge Medications: Please see discharge summary for a list of discharge medications.  Relevant Imaging Results:  Relevant Lab Results:   Additional Information SS#[id number]  Marinda Cooks, RN

## 2024-01-11 NOTE — ED Notes (Signed)
 Pt sitting in chair, daughter and wife are at bedside, pt is agreeable to rehab.  Discussed mobility at home and concern for falls.

## 2024-01-11 NOTE — ED Notes (Signed)
 Pt up to toilet in room, pt generally weak, pt needs assistance to ambulate.

## 2024-01-11 NOTE — TOC Progression Note (Signed)
 Transition of Care [facility]) - Progression Note    Patient Details  Name: Melvin Ferguson MRN: [id number] Date of Birth: August 06, 1931  [facility] CM/SW Contact  Melvin Cooks, RN Phone Number: 01/11/2024, 11:59 AM  Clinical Narrative:    This CM arrived at bedside and completed Initial assessment pt accompanied by his wife Mrs. Melvin Ferguson. Pt A&Ox4. Pt reports living within the [facility] community in the [place] w/o steps to enter. Pt has family support provided by his children. Pt uses [facility]  pharmacy on [address] PCP is as Metallurgist at the [facility]. Pt uses DME at home that includes rollator, shower chair, 3 in 1 commode, & transport chair. Pt also confirmed he has grab bars in the bathroom , and bed rails on his bed .Pt reports not  being in SNF prior to  this admission. Pt reports HH  aide services provided within [facility] via the agency Home Care that come 2hrs on Weds & 1hr on Friday and assist with ADLS, house keeping etc.. Pt informed he no longer drives , sharing his children provide his transportation needs. This CM spoke with admission liaison Melvin Ferguson at [facility] and confirmed that pt can transition into a Rehab bed if recommended at dc. This CM spoke with pt regarding this dc plan and option , pt informed he needed more time to confirm this option if needed . TOC  will cont to follow pt's DC planning / care coordination during hospital stay and update as applicable.     Expected Discharge Plan and Services  TBD   Social Drivers of Health (SDOH) Interventions SDOH Screenings   Food Insecurity: No Food Insecurity (02/06/2023)   Received from [facility]  Housing: Low Risk  (05/25/2023)   Received from [facility]  Transportation Needs: No Transportation Needs (02/06/2023)   Received from [facility]  Utilities: Not At Risk (02/06/2023)   Received from [facility]   Financial Resource Strain: Low Risk  (02/06/2023)   Received from [facility]  Social Connections: Unknown (09/10/2021)   Received from [facility]  Tobacco Use: Low Risk  (01/10/2024)    Readmission Risk Interventions     No data to display

## 2024-01-11 NOTE — ED Notes (Signed)
 Pt to and from toilet with significant assistance.

## 2024-01-11 NOTE — ED Notes (Signed)
Fall bundle in place

## 2024-01-11 NOTE — ED Provider Notes (Addendum)
 Patient continuing assessments as to the level of care needs.  Vitals:   01/11/24 0930 01/11/24 1150  BP: (!) 150/72 123/87  Pulse: (!) 58 60  Resp: 20 18  Temp:  98.2 F (36.8 C)  SpO2: 96% 96%     Patient relates he takes lisinopril  and Neurontin .  His blood pressures have been appropriate in the ER, will continue to hold lisinopril  for now as we monitor blood pressures, but will resume his gabapentin   TOC team has been working with him as well as [employer].  Both he and his wife report that he is considering potentially returning to the independent living section and then transitioning to rehab if he still requires a minute as an option at [facility] but they are still awaiting to hear what final word from [facility] as to well where he should be transferring to back to either the independent living or the rehab facility there.   Patient is awake alert fully oriented.  He is wife both very pleasant.  Reports he is starting to get a little more strength back than he had a couple days ago, but still feeling a little bit weak, but overall seems to be feeling better to the point that he is thinking he might be able to go back to his independent living and potentially transfer over to the rehab at [facility] if needed   Dicky Anes, MD 01/11/24 1613    Dicky Anes, MD 01/11/24 1614

## 2024-01-11 NOTE — ED Notes (Signed)
 Pt sitting in chair, meal tray given, pt has no requests at this time

## 2024-01-11 NOTE — Evaluation (Signed)
 Occupational Therapy Evaluation Patient Details Name: Melvin Ferguson MRN: [id number] DOB: March 01, 1932 Today's Date: 01/11/2024   History of Present Illness   Pt is a 89 y/o M admitted on 01/10/24 after presenting with c/o BLE weakness & inability to ambulate. PMH: anemia, arthritis, diverticulosis, HTN, kidney stones, lung nodule     Clinical Impressions Patient presenting with decreased Ind in self care, balance, functional mobility/transfers, endurance, and safety awareness. Patient reports being Mod I with mobility with use of rollator and living [place]. His wife also uses rollator for mobility at baseline. Pt has an aide that come 2x/wk for several hours to assist with ADLs and home management tasks. Pt needing mod A to stand from recliner chair today. Pt ambulates with rollator with min A 15' to toilet for BM. Pt needing max A for clothing management and hygiene. Pt unable to ambulate back and recliner chair moved closed to toiled and pt performed max A squat pivot transfer back into it. Pt is far from baseline at this time.  Patient will benefit from acute OT to increase overall independence in the areas of ADLs, functional mobility, and safety awareness in order to safely discharge.      If plan is discharge home, recommend the following:   A lot of help with walking and/or transfers;A lot of help with bathing/dressing/bathroom;Assistance with cooking/housework;Help with stairs or ramp for entrance;Assist for transportation     Functional Status Assessment   Patient has had a recent decline in their functional status and demonstrates the ability to make significant improvements in function in a reasonable and predictable amount of time.     Equipment Recommendations   Other (comment) (defer to next venue of care)      Precautions/Restrictions   Precautions Precautions: Fall     Mobility Bed Mobility Overal bed mobility: Needs Assistance Bed  Mobility: Supine to Sit     Supine to sit: Supervision, HOB elevated, Used rails          Transfers Overall transfer level: Needs assistance Equipment used: Rollator (4 wheels) Transfers: Sit to/from Stand Sit to Stand: Mod assist                  Balance Overall balance assessment: Needs assistance Sitting-balance support: Feet supported Sitting balance-Leahy Scale: Fair     Standing balance support: During functional activity, Bilateral upper extremity supported, Reliant on assistive device for balance Standing balance-Leahy Scale: Poor                             ADL either performed or assessed with clinical judgement   ADL Overall ADL's : Needs assistance/impaired     Grooming: Wash/dry hands;Set up;Sitting                   Toilet Transfer: Moderate assistance;Regular Toilet;Ambulation;Rollator (4 wheels)   Toileting- Clothing Manipulation and Hygiene: Maximal assistance Toileting - Clothing Manipulation Details (indicate cue type and reason): assistance for clothing management and hygiene             Vision Baseline Vision/History: 1 Wears glasses Patient Visual Report: No change from baseline              Pertinent Vitals/Pain Pain Assessment Pain Assessment: No/denies pain     Extremity/Trunk Assessment Upper Extremity Assessment Upper Extremity Assessment: Generalized weakness   Lower Extremity Assessment Lower Extremity Assessment: Defer to PT evaluation RLE Deficits / Details:  2+/5 knee extension in sitting, feels RLE is weaker than L & larger than L       Communication Communication Communication: No apparent difficulties   Cognition Arousal: Alert Behavior During Therapy: WFL for tasks assessed/performed Cognition: No apparent impairments                               Following commands: Intact       Cueing  General Comments   Cueing Techniques: Verbal cues              Home  Living Family/patient expects to be discharged to:: Private residence Living Arrangements: Spouse/significant other Available Help at Discharge: Family;Personal care attendant Type of Home: House       Home Layout: One level     Bathroom Shower/Tub: Walk-in shower         Home Equipment: Rollator (4 wheels)   Additional Comments: Pt lives at [facility] [employer], lives with wife who also uses a Occupational hygienist, recently started therapy services through [facility] again but no longer safe to use [employer] transportation to/from building so wife having to schedule transportation through insurance. New home health aide to assist with bathing.      Prior Functioning/Environment               Mobility Comments: Ambulatory with rollator, endorses hx of falls but unable to report number in past 6 months (had one last Thursday). Bed mobility from traditional bed with rail. ADLs Comments: Home health aide to assist with bathing and some home management tasks. Wife assist with underwear management. Wears briefs at baseline.    OT Problem List: Decreased strength;Decreased safety awareness;Decreased activity tolerance;Decreased knowledge of use of DME or AE;Impaired balance (sitting and/or standing);Decreased knowledge of precautions   OT Treatment/Interventions: Self-care/ADL training;Therapeutic activities;Therapeutic exercise;Energy conservation;Patient/family education;Balance training      OT Goals(Current goals can be found in the care plan section)   Acute Rehab OT Goals Patient Stated Goal: to go home OT Goal Formulation: With patient Time For Goal Achievement: 01/25/24 Potential to Achieve Goals: Fair ADL Goals Pt Will Perform Grooming: standing;with contact guard assist Pt Will Perform Lower Body Dressing: with contact guard assist;sit to/from stand Pt Will Transfer to Toilet: with contact guard assist;ambulating Pt Will Perform Toileting - Clothing Manipulation and hygiene:  with contact guard assist;sit to/from stand   OT Frequency:  Min 2X/week       AM-PAC OT 6 Clicks Daily Activity     Outcome Measure Help from another person eating meals?: None Help from another person taking care of personal grooming?: None Help from another person toileting, which includes using toliet, bedpan, or urinal?: A Lot Help from another person bathing (including washing, rinsing, drying)?: A Lot Help from another person to put on and taking off regular upper body clothing?: A Little Help from another person to put on and taking off regular lower body clothing?: A Lot 6 Click Score: 17   End of Session Equipment Utilized During Treatment: Rollator (4 wheels)  Activity Tolerance: Patient tolerated treatment well Patient left: with call bell/phone within reach;in chair;with family/visitor present  OT Visit Diagnosis: Unsteadiness on feet (R26.81);Repeated falls (R29.6);Muscle weakness (generalized) (M62.81);History of falling (Z91.81)                Time: 8569-8545 OT Time Calculation (min): 24 min Charges:  OT General Charges $OT Visit: 1 Visit OT Evaluation $OT Eval  Moderate Complexity: 1 Mod OT Treatments $Self Care/Home Management : 8-22 mins  Izetta Claude, MS, OTR/L , CBIS ascom [phone]  01/11/24, 4:01 PM

## 2024-01-11 NOTE — ED Notes (Signed)
 Helped pt to and from the toilet in the room, pt is unsteady on his feet, pt needs significant help to transition.  Pt states that he is wanting to go home, explored mobility at home and supports.

## 2024-01-11 NOTE — TOC Progression Note (Addendum)
 Transition of Care [facility]) - Progression Note    Patient Details  Name: Melvin Ferguson MRN: [id number] Date of Birth: 1931-12-07  [facility] CM/SW Contact  Marinda Cooks, RN Phone Number: 01/11/2024, 4:27 PM  Clinical Narrative:    This CM spoke with pt and his wife to discuss dc plan again after being updated pt is medically cleared to dc. Pt's allowed wife to speak on his behalf and she informed pt declined SNF/ Rehab recommendations at this time. This CM updated medical team and informed that FL2 was completed and needing covering MD co sign , in case pt changes his mind and agrees to SNF/Rehab within [facility] . No Additional TOC needs requested at this time .    17:15 pm- This CM spoke with pt , wife and pt's children and confirmed pt is now in agreement ot dc to [facility] for SNF/Rehab. This CM called and spoke with Admission liaison Alfonso and confirmed pt can be received tomm . TOC will cont to follow dc planning/ care coordination and update as applicable.    Expected Discharge Plan and Services    TBD HH with [facility] with HH PT/OT vs SNF/Rehab within [facility]   Social Drivers of Health (SDOH) Interventions SDOH Screenings   Food Insecurity: No Food Insecurity (02/06/2023)   Received from [facility]  Housing: Low Risk  (05/25/2023)   Received from [facility]  Transportation Needs: No Transportation Needs (02/06/2023)   Received from [facility]  Utilities: Not At Risk (02/06/2023)   Received from [facility]  Financial Resource Strain: Low Risk  (02/06/2023)   Received from [facility]  Social Connections: Unknown (09/10/2021)   Received from [facility]  Tobacco Use: Low Risk  (01/10/2024)    Readmission Risk Interventions     No data to display

## 2024-01-11 NOTE — ED Notes (Signed)
 ED stretcher replaced with hospital bed. Pt remains sitting up in recliner chair with son in room. Will let staff know when ready to get in bed.

## 2024-01-11 NOTE — Evaluation (Addendum)
 Physical Therapy Evaluation Patient Details Name: Melvin Ferguson MRN: [id number] DOB: [birth year] Today's Date: 01/11/2024  History of Present Illness  Pt is a 89 y/o M admitted on 01/10/24 after presenting with c/o BLE weakness & inability to ambulate. PMH: anemia, arthritis, diverticulosis, HTN, kidney stones, lung nodule  Clinical Impression  Pt seen for PT evaluation with pt agreeable, wife Lyndell) present for session. [name] with rollator, recently re-started therapy at [facility]. On this date, pt requires extra time & supervision to transition semi fowler to sitting EOB with HOB fully elevated, sit>stand from ED stretcher with min<>mod assist, & ambulate with rollator with min assist with impaired gait pattern as noted below. Pt with appreciable BLE weakness & shakiness noted during gait. Recommend ongoing acute PT services & post acute rehab <3 hours therapy/day upon d/c.      If plan is discharge home, recommend the following: A lot of help with walking and/or transfers;A lot of help with bathing/dressing/bathroom;Assist for transportation;Assistance with cooking/housework;Help with stairs or ramp for entrance   Can travel by private vehicle   Yes    Equipment Recommendations Other (comment) (defer to next venue)  Recommendations for Other Services    OT consult   Functional Status Assessment Patient has had a recent decline in their functional status and demonstrates the ability to make significant improvements in function in a reasonable and predictable amount of time.     Precautions / Restrictions Precautions Precautions: Fall Restrictions Weight Bearing Restrictions Per Provider Order: No      Mobility  Bed Mobility Overal bed mobility: Needs Assistance Bed Mobility: Supine to Sit     Supine to sit: Supervision, HOB elevated, Used rails (extra time to exit R side of bed)          Transfers Overall transfer level: Needs  assistance Equipment used: Rollator (4 wheels) Transfers: Sit to/from Stand Sit to Stand: Min assist, Mod assist           General transfer comment: cuing re: locking brakes & hand placement during stand>sit, min<>Mod assist for sit>stand from elevated ED stretcher    Ambulation/Gait Ambulation/Gait assistance: Min assist Gait Distance (Feet): 30 Feet Assistive device: Rollator (4 wheels) Gait Pattern/deviations: Decreased step length - right, Decreased step length - left, Decreased dorsiflexion - right, Decreased dorsiflexion - left, Decreased stride length Gait velocity: decreased     General Gait Details: Pt elects to ambulate in room vs hallway with rollator, verbally states left right left right to assist with stepping. Pt with occasional increased LLE step length, narrow BOS.  Stairs            Wheelchair Mobility     Tilt Bed    Modified Rankin (Stroke Patients Only)       Balance Overall balance assessment: Needs assistance Sitting-balance support: Feet supported Sitting balance-Leahy Scale: Fair     Standing balance support: During functional activity, Bilateral upper extremity supported, Reliant on assistive device for balance Standing balance-Leahy Scale: Poor                               Pertinent Vitals/Pain Pain Assessment Pain Assessment: No/denies pain    Home Living Family/patient expects to be discharged to:: Private residence Living Arrangements: Spouse/significant other Available Help at Discharge: Family;Personal care attendant Type of Home: House         Home Layout: One level Home Equipment:  Rollator (4 wheels) Additional Comments: Pt lives at [facility] [employer], lives with wife who also uses a rollator, recently started therapy services through [facility] again but no longer safe to use [employer] transportation to/from building so wife having to schedule transportation through insurance. New home health aide to  assist with bathing.    Prior Function               Mobility Comments: Ambulatory with rollator, endorses hx of falls but unable to report number in past 6 months (had one last Thursday). Bed mobility from traditional bed with rail. ADLs Comments: Home health aide to assist with bathing, wife assist with underwear management. Wears briefs at baseline.     Extremity/Trunk Assessment   Upper Extremity Assessment Upper Extremity Assessment: Generalized weakness    Lower Extremity Assessment Lower Extremity Assessment: Generalized weakness;RLE deficits/detail;LLE deficits/detail RLE Deficits / Details: 2+/5 knee extension in sitting, feels RLE is weaker than L & larger than L LLE Deficits / Details: 2+/5 knee extension in sitting, feels LLE is stronger than RLE       Communication   Communication Communication: No apparent difficulties    Cognition Arousal: Alert Behavior During Therapy: WFL for tasks assessed/performed   PT - Cognitive impairments: No apparent impairments                         Following commands: Intact       Cueing Cueing Techniques: Verbal cues     General Comments      Exercises     Assessment/Plan    PT Assessment Patient needs continued PT services  PT Problem List Decreased strength;Cardiopulmonary status limiting activity;Decreased range of motion;Decreased activity tolerance;Decreased knowledge of use of DME;Decreased balance;Decreased safety awareness;Decreased mobility;Decreased knowledge of precautions       PT Treatment Interventions DME instruction;Balance training;Neuromuscular re-education;Gait training;Functional mobility training;Therapeutic activities;Patient/family education;Therapeutic exercise;Manual techniques    PT Goals (Current goals can be found in the Care Plan section)  Acute Rehab PT Goals Patient Stated Goal: get better PT Goal Formulation: With patient/family Time For Goal Achievement:  01/25/24 Potential to Achieve Goals: Good    Frequency Min 2X/week     Co-evaluation               AM-PAC PT 6 Clicks Mobility  Outcome Measure Help needed turning from your back to your side while in a flat bed without using bedrails?: A Little Help needed moving from lying on your back to sitting on the side of a flat bed without using bedrails?: A Little Help needed moving to and from a bed to a chair (including a wheelchair)?: A Little Help needed standing up from a chair using your arms (e.g., wheelchair or bedside chair)?: A Lot Help needed to walk in hospital room?: A Little Help needed climbing 3-5 steps with a railing? : Total 6 Click Score: 15    End of Session   Activity Tolerance: Patient tolerated treatment well;Patient limited by fatigue Patient left: in chair;with call bell/phone within reach;with chair alarm set   PT Visit Diagnosis: History of falling (Z91.81);Other abnormalities of gait and mobility (R26.89);Unsteadiness on feet (R26.81);Difficulty in walking, not elsewhere classified (R26.2);Muscle weakness (generalized) (M62.81)    Time: 8980-8961 PT Time Calculation (min) (ACUTE ONLY): 19 min   Charges:   PT Evaluation $PT Eval Low Complexity: 1 Low   PT General Charges $$ ACUTE PT VISIT: 1 Visit  Richerd Pinal, PT, DPT 01/11/24, 10:52 AM   Richerd CHRISTELLA Pinal 01/11/2024, 10:50 AM

## 2024-01-11 NOTE — ED Notes (Signed)
 Pt called out because he wanted to be pulled up in bed I got A side tech to help me pull pt up asked could get he anything else he saud no

## 2024-01-11 NOTE — ED Notes (Signed)
 Pt agrees to rehab, plan is for pt to go to [facility] tomorrow am

## 2024-01-12 DIAGNOSIS — R262 Difficulty in walking, not elsewhere classified: Secondary | ICD-10-CM | POA: Diagnosis not present

## 2024-01-12 NOTE — ED Notes (Signed)
 [facility] called for  transport  to  [facility]

## 2024-01-12 NOTE — ED Notes (Signed)
 RN and pt son was able to get pt cleaned up from incontinence. Pt was not able to fully stand. RN talked to pt and family about pt taking [facility] as a transport to the facility due to a very high fall risk. RN advised that pt is not able to fully stand let alone pivot to get into a wheelchair let alone the car. Pt sts I think going by ambulance would be the safest route.

## 2024-01-12 NOTE — ED Notes (Signed)
 RN attempted to call report to [facility].

## 2024-01-12 NOTE — ED Notes (Signed)
 Pt Dc'd to [facility] at this time with [facility] ambulance transport. All questions and concerns answered by family and pt answered. Family states they will be meeting pt at [facility] facility.

## 2024-01-12 NOTE — TOC CM/SW Note (Signed)
..  Transition of Care [facility]) - Inpatient Brief Assessment   Patient Details  Name: Melvin Ferguson MRN: [id number] Date of Birth: 1931/09/28  Transition of Care [facility]) CM/SW Contact:    Edsel DELENA Fischer, LCSW Phone Number: 01/12/2024, 5:03 PM   Clinical Narrative:  SW spoke to [place] with [facility].  Secure messaged over AVS to [place].  Family will provide transportation to facility.  Room 102 Transition of Care Asessment:

## 2024-01-12 NOTE — Discharge Instructions (Addendum)
 Return to the ER for new or worsening symptoms.  Your blood pressures here were normal without receiving lisinopril .  I recommend holding this medication and discussing it with your primary care doctor to see if they recommend resuming it.

## 2024-01-12 NOTE — ED Provider Notes (Signed)
 Received notification from social work that patient was cleared for discharge to [facility] with family to pick him up.  Reviewed ER visit.  Initially presented with lower extremity weakness.  Medical evaluation overall reassuring and patient is medically cleared.  Given weakness with concerns about being able to safely return home, social work was consulted.  They spoke with family and patient was agreeable to SNF/rehab within [facility].  Family to pick up patient today.  Do note that patient's lisinopril  has been held during ER visit with overall normotensive blood pressure readings most recently 120/68.  Will recommend that this continue to be held at discharge.  Patient discharged stable condition.   Levander Slate, MD 01/12/24 [phone]

## 2024-01-13 ENCOUNTER — Non-Acute Institutional Stay (SKILLED_NURSING_FACILITY): Payer: Self-pay | Admitting: Adult Health

## 2024-01-13 ENCOUNTER — Encounter: Payer: Self-pay | Admitting: Adult Health

## 2024-01-13 DIAGNOSIS — I10 Essential (primary) hypertension: Secondary | ICD-10-CM

## 2024-01-13 DIAGNOSIS — G629 Polyneuropathy, unspecified: Secondary | ICD-10-CM | POA: Diagnosis not present

## 2024-01-13 DIAGNOSIS — R531 Weakness: Secondary | ICD-10-CM | POA: Diagnosis not present

## 2024-01-13 NOTE — Progress Notes (Signed)
 Location:  Other [facility].  Nursing Home Room Number: [facility] Place of Service:  SNF (31) Provider:  Medina-Vargas, Jahmal Dunavant, DNP, FNP-BC  Patient Care Team: Alla Amis, MD as PCP - General [facility])  Extended Emergency Contact Information Primary Emergency Contact: [facility] Address: [address]          [place], [place] [address] [place] Home Phone: [phone] Mobile Phone: [phone] Relation: Spouse  Code Status:  Full Code.   Goals of care: Advanced Directive information    01/10/2024    3:03 PM  Advanced Directives  Does Patient Have a Medical Advance Directive? No  Does patient want to make changes to medical advance directive? No - Patient declined     Chief Complaint  Patient presents with   Hospitalization Follow-up    Hospital Follow up    HPI:  Pt is a 89 y.o. male seen today for Hospital Follow up.   He experienced generalized weakness, particularly in the lower extremities, which led to a visit to the emergency department on January 11, 2024. Lisinopril  was held in the ED and BP has been appropriate. He lives at [facility] [employer] There was concern for safety returning home. It was decided for him to have a short-term rehabilitation.  He was admitted to [facility] on 01/12/24.  He has a history of neuropathy and is currently managed with gabapentin  100 mg daily.  Past Medical History:  Diagnosis Date   Anemia    Bradycardia    Cancer (HCC)    skin   Dental bridge present    Permanent Bottom Right   Diabetes mellitus without complication (HCC)    diet controlled   Diverticulitis    osis   Heart murmur    History of kidney stones    HOH (hard of hearing)    Hypertension    Kidney stones    Presence of permanent cardiac pacemaker 02/25/2017   [employer] Azure XT DR [id number]   Prostate disorder    Transfusion history    Past Surgical History:   Procedure Laterality Date   CATARACT EXTRACTION W/PHACO Left 08/10/2019   Procedure: CATARACT EXTRACTION PHACO AND INTRAOCULAR LENS PLACEMENT (IOC) LEFT DIABETIC 9.74  01:16.4  12.7% ;  Surgeon: Mittie Gaskin, MD;  Location: [facility];  Service: Ophthalmology;  Laterality: Left;  Diabetic - diet controlled   CATARACT EXTRACTION W/PHACO Right 08/31/2019   Procedure: CATARACT EXTRACTION PHACO AND INTRAOCULAR LENS PLACEMENT (IOC) RIGHT DIABETIC;  Surgeon: Mittie Gaskin, MD;  Location: [facility];  Service: Ophthalmology;  Laterality: Right;  10.94 1:03.4 17.3%   COLONOSCOPY     HEMORROIDECTOMY     HERNIA REPAIR     PACEMAKER INSERTION N/A 02/25/2017   Procedure: INSERTION PACEMAKER;  Surgeon: Ammon Blunt, MD;  Location: [facility] ORS;  Service: Cardiovascular;  Laterality: N/A;   penile resection     PILONIDAL CYST EXCISION     PROSTATE SURGERY      No Known Allergies  Outpatient Encounter Medications as of 01/13/2024  Medication Sig   Acetaminophen  (TYLENOL  DISSOLVE PACKS) 500 MG PACK Take 1 packet by mouth every 6 (six) hours as needed.   acetaminophen  (TYLENOL ) 500 MG tablet Take 500 mg by mouth every 6 (six) hours as needed.   aspirin 81 MG EC tablet Take 81 mg by mouth daily.   Calcium Citrate-Vitamin D (CITRACAL PETITES/VITAMIN D) 200-250 MG-UNIT TABS Take 1 tablet by mouth 2 (two) times  daily.   gabapentin  (NEURONTIN ) 100 MG capsule Take 100 mg by mouth daily. Take 3 tablets by mouth at bedtime.   Magnesium 250 MG TABS Take 1 tablet by mouth daily.   Multiple Vitamins-Minerals (CENTRUM SILVER ULTRA MENS PO) Take 1 tablet by mouth daily.   ondansetron  (ZOFRAN -ODT) 4 MG disintegrating tablet Take 1 tablet (4 mg total) by mouth every 8 (eight) hours as needed for nausea or vomiting.   psyllium (METAMUCIL) 58.6 % packet Take 1 packet by mouth daily.   TRUE METRIX BLOOD GLUCOSE TEST test strip daily. for testing as directed   bismuth subsalicylate  (PEPTO BISMOL) 262 MG chewable tablet Chew by mouth as needed. (Patient not taking: Reported on 01/13/2024)   hydrocortisone cream 1 % Apply topically 2 (two) times daily. (Patient not taking: Reported on 01/13/2024)   [Paused] lisinopril  (PRINIVIL ,ZESTRIL ) 30 MG tablet Take 1 tablet by mouth daily. (Patient not taking: Reported on 01/13/2024)   mupirocin ointment (BACTROBAN) 2 % Apply topically. (Patient not taking: Reported on 01/13/2024)   naproxen sodium (ALEVE) 220 MG tablet Take by mouth. (Patient not taking: Reported on 01/13/2024)   Probiotic Product (CULTURELLE PROBIOTICS PO) Take by mouth daily. (Patient not taking: Reported on 01/13/2024)   triamcinolone cream (KENALOG) 0.1 % APPLY TOPICALLY TO RASH TWICE DAILY UNTIL GONE (Patient not taking: Reported on 01/13/2024)   No facility-administered encounter medications on file as of 01/13/2024.    Review of Systems  Constitutional:  Negative for activity change, appetite change and fever.  HENT:  Negative for sore throat.   Eyes: Negative.   Cardiovascular:  Negative for chest pain and leg swelling.  Gastrointestinal:  Negative for abdominal distention, diarrhea and vomiting.  Genitourinary:  Negative for dysuria, frequency and urgency.  Skin:  Negative for color change.  Neurological:  Positive for weakness. Negative for dizziness and headaches.  Psychiatric/Behavioral:  Negative for behavioral problems and sleep disturbance. The patient is not nervous/anxious.       Immunization History  Administered Date(s) Administered   INFLUENZA, HIGH DOSE SEASONAL PF 01/29/2017, 02/08/2019, 02/06/2021   Influenza,trivalent, recombinat, inj, PF 01/27/2018   Influenza-Unspecified 03/31/2012, 02/14/2013, 02/15/2015, 02/04/2016, 01/29/2017, 02/20/2020   PFIZER Comirnaty(Gray Top)Covid-19 Tri-Sucrose Vaccine 06/11/2019, 01/11/2020, 08/15/2020   PFIZER(Purple Top)SARS-COV-2 Vaccination 05/21/2019   Pfizer Covid-19 Vaccine Bivalent Booster 80yrs & up  02/06/2021   Pfizer(Comirnaty)Fall Seasonal Vaccine 12 years and older 03/05/2022   Pneumococcal Conjugate-13 06/15/2014   Pneumococcal Polysaccharide-23 05/13/2012   Tdap 11/14/2020   Zoster Recombinant(Shingrix) 06/26/2017, 09/16/2017, 02/16/2018   Pertinent  Health Maintenance Due  Topic Date Due   Influenza Vaccine  11/27/2023      08/10/2019    8:29 AM 08/31/2019    7:13 AM 09/17/2019    7:08 PM 10/30/2019   12:57 PM  Fall Risk  (RETIRED) Patient Fall Risk Level Moderate fall risk  Moderate fall risk  Low fall risk  Low fall risk      Data saved with a previous flowsheet row definition     Vitals:   01/13/24 0945  BP: 110/61  Pulse: 60  Resp: 18  Temp: 97.8 F (36.6 C)  SpO2: 95%  Weight: 176 lb (79.8 kg)  Height: 6' 1 (1.854 m)   Body mass index is 23.22 kg/m.  Physical Exam Constitutional:      Appearance: Normal appearance.  HENT:     Head: Normocephalic and atraumatic.     Mouth/Throat:     Mouth: Mucous membranes are moist.  Eyes:  Conjunctiva/sclera: Conjunctivae normal.  Cardiovascular:     Rate and Rhythm: Normal rate and regular rhythm.     Pulses: Normal pulses.     Heart sounds: Normal heart sounds.  Pulmonary:     Effort: Pulmonary effort is normal.     Breath sounds: Normal breath sounds.  Abdominal:     General: Bowel sounds are normal.     Palpations: Abdomen is soft.  Musculoskeletal:        General: No swelling. Normal range of motion.     Cervical back: Normal range of motion.  Skin:    General: Skin is warm and dry.  Neurological:     General: No focal deficit present.     Mental Status: He is alert and oriented to person, place, and time.  Psychiatric:        Mood and Affect: Mood normal.        Behavior: Behavior normal.        Thought Content: Thought content normal.        Judgment: Judgment normal.      Labs reviewed: Recent Labs    04/27/23 0832 01/10/24 1504  NA 139 139  K 4.1 4.0  CL 103 105  CO2 23 25   GLUCOSE 136* 128*  BUN 21 26*  CREATININE 0.62 0.59*  CALCIUM 9.2 8.9   Recent Labs    01/10/24 1504  AST 27  ALT 20  ALKPHOS 82  BILITOT 0.8  PROT 6.9  ALBUMIN 4.0   Recent Labs    04/27/23 0832 01/10/24 1504  WBC 5.5 6.6  NEUTROABS  --  4.0  HGB 12.3* 12.7*  HCT 37.0* 38.6*  MCV 98.9 97.5  PLT 207 198   No results found for: TSH No results found for: HGBA1C No results found for: CHOL, HDL, LDLCALC, LDLDIRECT, TRIG, CHOLHDL  Significant Diagnostic Results in last 30 days:  No results found.  Assessment/Plan  1. Generalized weakness (Primary) -  Cleared for discharge after ED evaluation. Recommended short-term rehabilitation for safety concerns. -  will have PT and OT for therapeutic strengthening exercises -  fall precautions  2. Essential hypertension -  BP 110/61, stable -  Lisinopril  held -  monitor BP  3. Neuropathy -  Continue gabapentin  100 mg daily      Family/ staff Communication: Discussed plan of care with resident and charge nurse.  Labs/tests ordered:  CBC and BMP in 1 week.    Quinzell Malcomb Medina-Vargas, DNP, MSN, FNP-BC [facility] [phone] (Monday-Friday 8:00 a.m. - 5:00 p.m.) [phone] (after hours)

## 2024-01-18 LAB — CBC AND DIFFERENTIAL
HCT: 36 — AB (ref 41–53)
Hemoglobin: 11.7 — AB (ref 13.5–17.5)
Neutrophils Absolute: 2555
Platelets: 187 K/uL (ref 150–400)
WBC: 5.3

## 2024-01-18 LAB — BASIC METABOLIC PANEL WITH GFR
BUN: 25 — AB (ref 4–21)
CO2: 30 — AB (ref 13–22)
Chloride: 108 (ref 99–108)
Creatinine: 0.7 (ref 0.6–1.3)
Glucose: 132
Potassium: 4.3 meq/L (ref 3.5–5.1)
Sodium: 143 (ref 137–147)

## 2024-01-18 LAB — COMPREHENSIVE METABOLIC PANEL WITH GFR
Calcium: 8.8 (ref 8.7–10.7)
eGFR: 90

## 2024-01-18 LAB — CBC: RBC: 3.67 — AB (ref 3.87–5.11)

## 2024-01-20 ENCOUNTER — Non-Acute Institutional Stay (SKILLED_NURSING_FACILITY): Payer: Self-pay | Admitting: Internal Medicine

## 2024-01-20 ENCOUNTER — Encounter: Payer: Self-pay | Admitting: Internal Medicine

## 2024-01-20 DIAGNOSIS — I10 Essential (primary) hypertension: Secondary | ICD-10-CM

## 2024-01-20 DIAGNOSIS — R3121 Asymptomatic microscopic hematuria: Secondary | ICD-10-CM | POA: Diagnosis not present

## 2024-01-20 DIAGNOSIS — G603 Idiopathic progressive neuropathy: Secondary | ICD-10-CM | POA: Diagnosis not present

## 2024-01-20 NOTE — Patient Instructions (Signed)
 See assessment and plan under each diagnosis in the problem list and acutely for this visit

## 2024-01-20 NOTE — Progress Notes (Unsigned)
 NURSING HOME LOCATION: [facility]  ROOM NUMBER:  102A  CODE STATUS:  Full Code  PCP:  Dr. Alda Carpen  This is a comprehensive admission note to this SNFperformed on this date less than 30 days from date of admission. Included are preadmission medical/surgical history; reconciled medication list; family history; social history and comprehensive review of systems.  Corrections and additions to the records were documented. Comprehensive physical exam was also performed. Additionally a clinical summary was entered for each active diagnosis pertinent to this admission in the Problem List to enhance continuity of care.  HPI: He was seen in the ED 9/14-15/2025 for generalized weakness, especially in the lower extremities.  The weakness was slightly more prominent in the right lower extremity which apparently is pre-existing.  He has a history of neuropathy and is on gabapentin .  He typically uses a walker for mobilization.   Labs in the ED revealed mild hyperglycemia with a glucose of 128.  There is no A1c on record.  There was minimal prerenal azotemia with a BUN of 26.  Creatinine was 0.59 and GFR greater than 60, indicating CKD stage II.  Normochromic, normocytic anemia was present with H/H of 12.7/38.6.  Urinalysis revealed large amount of hemoglobin with negative nitrites and negative leukocytes.  There were greater than 50 RBCs per high-powered field.  Urine was cultured; C&S revealed no growth. No CK or TSH on record.  A1c was 7% on 11/23/2023.   He had been residing in independent living at [facility]; but because of concern for safety he was admitted to the SNF for short-term rehab.  Past medical and surgical history: Includes history of skin cancer; diabetes without complication; history of diverticulitis; history of nephrolithiasis; essential hypertension; and history of prostate disorder. Surgeries and procedures include colonoscopy; hernia repair; prostate  surgery; pacemaker insertion.  Family history: reviewed, non contributory due to advanced age.  Social history: Nondrinker; non-smoker.  He is a retired Psychologist, sport and exercise.   Review of systems: He seemed unsure of why he had been sent to the ED 9/14.  I talked to his wife who said that while attempting to go the bathroom with the assistance of his adult child and their spouse, he had become dead weight and had to be supported to the floor.  She stated that he has had a steady decline in the peripheral neuropathic symptoms over the past year.  This is despite having PT/OT twice a week as an outpatient.  He states that at times he does have numbness in his feet but denies any tingling.  He denies any incontinence of urine or stool.  Neurologist Dr. Lane saw him on 8/27; Gabapentin  was prescribed at 100 mg in the morning and 300 mg at night.  He was advised to take an extra pill during the day as needed.  The note states that he was considering starting Lyrica and/or nortriptyline at follow up visit for the neuropathy.  Constitutional: No fever, significant weight change  Eyes: No redness, discharge, pain, vision change ENT/mouth: No nasal congestion, purulent discharge, earache, change in hearing, sore throat  Cardiovascular: No chest pain, palpitations, paroxysmal nocturnal dyspnea, claudication, edema  Respiratory: No cough, sputum production, hemoptysis, DOE, significant snoring, apnea Gastrointestinal: No heartburn, dysphagia, abdominal pain, nausea /vomiting, rectal bleeding, melena, change in bowels Genitourinary: No dysuria, hematuria, pyuria,nocturia Musculoskeletal: No joint stiffness, joint swelling, pain Dermatologic: No rash, pruritus, change in appearance of skin Neurologic: No dizziness, headache, syncope, seizures Psychiatric: No  significant anxiety, depression, insomnia, anorexia Endocrine: No change in hair/skin/nails, excessive thirst, excessive hunger, excessive urination   Hematologic/lymphatic: No significant bruising, lymphadenopathy, abnormal bleeding Allergy/immunology: No itchy/watery eyes, significant sneezing, urticaria, angioedema  Physical exam:  Pertinent or positive findings: He appears younger than stated age.  There is some misalignment of the mandibular teeth.  Intermittently there is some delay in word retrieval.  He has a grade 1 systolic murmur at the base.  Pacer is present over the left anterior chest.  Pedal pulses are decreased.  He has trace edema at the sock line.  Deep tendon reflexes are 1+ in the upper extremities and 0-1/2+ in the lower extremities.  Strength to opposition is better in the upper extremities than in the lower extremities.  There is no definite strength asymmetry between the 2 upper extremities or lower extremities.  He does have interosseous wasting.  He has a bruise at the base of the left thumb.  General appearance: Adequately nourished; no acute distress, increased work of breathing is present.   Lymphatic: No lymphadenopathy about the head, neck, axilla. Eyes: No conjunctival inflammation or lid edema is present. There is no scleral icterus. Ears:  External ear exam shows no significant lesions or deformities.   Nose:  External nasal examination shows no deformity or inflammation. Nasal mucosa are pink and moist without lesions, exudates Neck:  No thyromegaly, masses, tenderness noted.    Heart:  Normal rate and regular rhythm. S1 and S2 normal without gallop,  click, rub.  Lungs: Chest clear to auscultation without wheezes, rhonchi, rales, rubs. Abdomen: Bowel sounds are normal.  Abdomen is soft and nontender with no organomegaly, hernias, masses. GU: Deferred  Extremities:  No cyanosis, clubbing. Neurologic exam: Balance, Rhomberg, finger to nose testing could not be completed due to clinical state Skin: Warm & dry w/o tenting. No significant lesions or rash.  See clinical summary under each active problem in the  Problem List with associated updated therapeutic plan:  Peripheral neuropathy Episode of severe lower extremity weakness required total support.  Admitted to the [facility] for rehab. Neurology note 8/27 reviewed.  Gabapentin  dose adjusted.  Dr. Cammie is considering starting Lyrica and/or nortriptyline when next seen.  Hematuria Clean-catch urinalysis should be rechecked for persistent hematuria with urologic follow-up if it present.  Essential hypertension BP controlled; his ACE inhibitor is being held.  Continue to monitor.

## 2024-01-22 DIAGNOSIS — R319 Hematuria, unspecified: Secondary | ICD-10-CM | POA: Insufficient documentation

## 2024-01-22 DIAGNOSIS — G629 Polyneuropathy, unspecified: Secondary | ICD-10-CM | POA: Insufficient documentation

## 2024-01-22 NOTE — Assessment & Plan Note (Signed)
 BP controlled; his ACE inhibitor is being held.  Continue to monitor.

## 2024-01-22 NOTE — Assessment & Plan Note (Signed)
 Clean-catch urinalysis should be rechecked for persistent hematuria with urologic follow-up if it present.

## 2024-01-22 NOTE — Assessment & Plan Note (Signed)
 Episode of severe lower extremity weakness required total support.  Admitted to the [facility] for rehab. Neurology note 8/27 reviewed.  Gabapentin  dose adjusted.  Dr. Cammie is considering starting Lyrica and/or nortriptyline when next seen.

## 2024-02-01 ENCOUNTER — Telehealth: Payer: Self-pay

## 2024-02-01 NOTE — Telephone Encounter (Signed)
 Copied from CRM [id number]. Topic: General - Other >> Jan 29, 2024  4:23 PM Debby BROCKS wrote: Reason for CRM: Patients son Shoua Ressler) called to state that [employer] will be sending paperwork for the patient for Dr. Richerd Brigham to sign. Its to file a claim for long term claim insurance.   Callback for son: [phone]

## 2024-02-01 NOTE — Telephone Encounter (Signed)
 Will check paper once received. None noted at [employer]

## 2024-02-12 ENCOUNTER — Non-Acute Institutional Stay (SKILLED_NURSING_FACILITY): Payer: Self-pay | Admitting: Orthopedic Surgery

## 2024-02-12 ENCOUNTER — Encounter: Payer: Self-pay | Admitting: Orthopedic Surgery

## 2024-02-12 DIAGNOSIS — I495 Sick sinus syndrome: Secondary | ICD-10-CM | POA: Diagnosis not present

## 2024-02-12 DIAGNOSIS — R410 Disorientation, unspecified: Secondary | ICD-10-CM

## 2024-02-12 DIAGNOSIS — R531 Weakness: Secondary | ICD-10-CM

## 2024-02-12 DIAGNOSIS — I10 Essential (primary) hypertension: Secondary | ICD-10-CM | POA: Diagnosis not present

## 2024-02-12 DIAGNOSIS — G629 Polyneuropathy, unspecified: Secondary | ICD-10-CM

## 2024-02-12 NOTE — Progress Notes (Signed)
 Location:  Other Nursing Home Room Number: 102 A Place of Service:  SNF (31) Provider:  Greig Cluster, NP   Patient Care Team: Alla Amis, MD as PCP - General (Family Medicine)  Extended Emergency Contact Information Primary Emergency Contact: [facility] Address: [address]          [place], [place] [address] [place] Home Phone: [phone] Mobile Phone: [phone] Relation: Spouse  Code Status:  CPR-FULL CODE Goals of care: Advanced Directive information    01/10/2024    3:03 PM  Advanced Directives  Does Patient Have a Medical Advance Directive? No  Does patient want to make changes to medical advance directive? No - Patient declined     Chief Complaint  Patient presents with   Routine VIsit    HPI:  Pt is a 89 y.o. male seen today for medical management of chronic diseases.    He currently resides on the skilled nursing unit at [facility]. PMH: atherosclerosis, HTN, SSS s/p pacemaker, lung nodule, peripheral neuropathy, basal cell carcinoma, CKD stage II, renal calculi, prediabetes and anemia.   Admitted to SNF 09/16 due to generalized weakness. ED visit 09/14-09/16> lab work and UA unremarkable.   Generalized weakness- recently discharged from PT, hoyer transfer, skilled nursing recommended, ambulates with wheelchair HTN- BUN/creat 25/0.7 01/18/2024, lisinopril  held last ED visit, see pressures below Neuropathy- remains on gabapentin  Confusion- noted 10/09, ST consulted for cognitive testing, no behaviors  Recent weights:  10/15- 179.4 lbs  09/16- 176 lbs   Recent blood pressures:  10/17- 131/68  10/16- 113/66  10/15- 135/74     Past Medical History:  Diagnosis Date   Anemia    Bradycardia    Cancer (HCC)    skin   Dental bridge present    Permanent Bottom Right   Diabetes mellitus without complication (HCC)    diet controlled   Diverticulitis    osis   Heart murmur    History of kidney stones    HOH (hard of  hearing)    Hypertension    Kidney stones    Presence of permanent cardiac pacemaker 02/25/2017   [employer] Azure XT DR [id number]   Prostate disorder    Transfusion history    Past Surgical History:  Procedure Laterality Date   CATARACT EXTRACTION W/PHACO Left 08/10/2019   Procedure: CATARACT EXTRACTION PHACO AND INTRAOCULAR LENS PLACEMENT (IOC) LEFT DIABETIC 9.74  01:16.4  12.7% ;  Surgeon: Mittie Gaskin, MD;  Location: [facility];  Service: Ophthalmology;  Laterality: Left;  Diabetic - diet controlled   CATARACT EXTRACTION W/PHACO Right 08/31/2019   Procedure: CATARACT EXTRACTION PHACO AND INTRAOCULAR LENS PLACEMENT (IOC) RIGHT DIABETIC;  Surgeon: Mittie Gaskin, MD;  Location: [facility];  Service: Ophthalmology;  Laterality: Right;  10.94 1:03.4 17.3%   COLONOSCOPY     HEMORROIDECTOMY     HERNIA REPAIR     PACEMAKER INSERTION N/A 02/25/2017   Procedure: INSERTION PACEMAKER;  Surgeon: Ammon Blunt, MD;  Location: [facility] ORS;  Service: Cardiovascular;  Laterality: N/A;   penile resection     PILONIDAL CYST EXCISION     PROSTATE SURGERY      No Known Allergies  Outpatient Encounter Medications as of 02/12/2024  Medication Sig   acetaminophen  (TYLENOL ) 500 MG tablet Take 500 mg by mouth every 6 (six) hours as needed.   aspirin 81 MG EC tablet Take 81 mg by mouth daily.   Calcium Citrate-Vitamin D (CITRACAL PETITES/VITAMIN D) 200-250 MG-UNIT TABS Take 1 tablet  by mouth 2 (two) times daily.   gabapentin  (NEURONTIN ) 100 MG capsule Take 100 mg by mouth daily. Take 3 tablets by mouth at bedtime.   Magnesium 250 MG TABS Take 1 tablet by mouth daily.   Multiple Vitamins-Minerals (CENTRUM SILVER ULTRA MENS PO) Take 1 tablet by mouth daily.   ondansetron  (ZOFRAN -ODT) 4 MG disintegrating tablet Take 1 tablet (4 mg total) by mouth every 8 (eight) hours as needed for nausea or vomiting.   psyllium (METAMUCIL) 58.6 % packet Take 1 packet by mouth daily.    Acetaminophen  (TYLENOL  DISSOLVE PACKS) 500 MG PACK Take 1 packet by mouth every 6 (six) hours as needed. (Patient not taking: Reported on 02/12/2024)   bismuth subsalicylate (PEPTO BISMOL) 262 MG chewable tablet Chew by mouth as needed. (Patient not taking: Reported on 02/12/2024)   hydrocortisone cream 1 % Apply topically 2 (two) times daily. (Patient not taking: Reported on 02/12/2024)   [Paused] lisinopril  (PRINIVIL ,ZESTRIL ) 30 MG tablet Take 1 tablet by mouth daily. (Patient not taking: Reported on 02/12/2024)   mupirocin ointment (BACTROBAN) 2 % Apply topically. (Patient not taking: Reported on 02/12/2024)   naproxen sodium (ALEVE) 220 MG tablet Take by mouth. (Patient not taking: Reported on 02/12/2024)   Probiotic Product (CULTURELLE PROBIOTICS PO) Take by mouth daily. (Patient not taking: Reported on 02/12/2024)   triamcinolone cream (KENALOG) 0.1 % APPLY TOPICALLY TO RASH TWICE DAILY UNTIL GONE (Patient not taking: Reported on 02/12/2024)   TRUE METRIX BLOOD GLUCOSE TEST test strip daily. for testing as directed (Patient not taking: Reported on 02/12/2024)   No facility-administered encounter medications on file as of 02/12/2024.    Review of Systems  Constitutional:  Negative for fatigue and fever.  HENT:  Negative for sore throat and trouble swallowing.   Eyes:  Negative for visual disturbance.  Respiratory:  Negative for shortness of breath.   Cardiovascular:  Negative for chest pain.  Gastrointestinal:  Negative for abdominal distention, abdominal pain, constipation and diarrhea.  Genitourinary:  Negative for hematuria.  Musculoskeletal:  Positive for gait problem.  Skin:  Negative for wound.  Neurological:  Positive for weakness. Negative for dizziness and headaches.  Psychiatric/Behavioral:  Positive for confusion. Negative for dysphoric mood and sleep disturbance. The patient is not nervous/anxious.     Immunization History  Administered Date(s) Administered   INFLUENZA,  HIGH DOSE SEASONAL PF 01/29/2017, 02/08/2019, 02/06/2021   Influenza,trivalent, recombinat, inj, PF 01/27/2018   Influenza-Unspecified 03/31/2012, 02/14/2013, 02/15/2015, 02/04/2016, 01/29/2017, 02/20/2020, 02/09/2024   PFIZER Comirnaty(Gray Top)Covid-19 Tri-Sucrose Vaccine 06/11/2019, 01/11/2020, 08/15/2020   PFIZER(Purple Top)SARS-COV-2 Vaccination 05/21/2019   Pfizer Covid-19 Vaccine Bivalent Booster 59yrs & up 02/06/2021   Pfizer(Comirnaty)Fall Seasonal Vaccine 12 years and older 03/05/2022   Pneumococcal Conjugate-13 06/15/2014   Pneumococcal Polysaccharide-23 05/13/2012   Tdap 11/14/2020   Unspecified SARS-COV-2 Vaccination 04/12/2023   Zoster Recombinant(Shingrix) 06/26/2017, 09/16/2017, 02/16/2018   Pertinent  Health Maintenance Due  Topic Date Due   Influenza Vaccine  Completed      08/10/2019    8:29 AM 08/31/2019    7:13 AM 09/17/2019    7:08 PM 10/30/2019   12:57 PM  Fall Risk  (RETIRED) Patient Fall Risk Level Moderate fall risk  Moderate fall risk  Low fall risk  Low fall risk      Data saved with a previous flowsheet row definition   Functional Status Survey:    Vitals:   02/12/24 1102  BP: 113/66  Pulse: 63  Resp: 18  Temp: (!) 96 F (35.6 C)  SpO2: 94%  Weight: 179 lb 6.4 oz (81.4 kg)  Height: 6' 1 (1.854 m)   Body mass index is 23.67 kg/m. Physical Exam Vitals reviewed.  Constitutional:      General: He is not in acute distress. HENT:     Head: Normocephalic.     Right Ear: There is no impacted cerumen.     Left Ear: There is no impacted cerumen.     Nose: Nose normal.     Mouth/Throat:     Mouth: Mucous membranes are moist.  Eyes:     General:        Right eye: No discharge.        Left eye: No discharge.  Cardiovascular:     Rate and Rhythm: Normal rate and regular rhythm.     Pulses: Normal pulses.     Heart sounds: Normal heart sounds.  Pulmonary:     Effort: Pulmonary effort is normal.     Breath sounds: Normal breath sounds.   Abdominal:     General: Bowel sounds are normal. There is no distension.     Palpations: Abdomen is soft.     Tenderness: There is no abdominal tenderness.  Musculoskeletal:     Cervical back: Neck supple.     Right lower leg: No edema.     Left lower leg: No edema.  Skin:    General: Skin is warm.     Capillary Refill: Capillary refill takes less than 2 seconds.  Neurological:     General: No focal deficit present.     Mental Status: He is alert. Mental status is at baseline.     Motor: Weakness present.     Gait: Gait abnormal.     Comments: Hoyer transfer  Psychiatric:        Mood and Affect: Mood normal.     Comments: Very pleasant, alert to self/person/place/time, follows commands     Labs reviewed: Recent Labs    04/27/23 0832 01/10/24 1504 01/18/24 0000  NA 139 139 143  K 4.1 4.0 4.3  CL 103 105 108  CO2 23 25 30*  GLUCOSE 136* 128*  --   BUN 21 26* 25*  CREATININE 0.62 0.59* 0.7  CALCIUM 9.2 8.9 8.8   Recent Labs    01/10/24 1504  AST 27  ALT 20  ALKPHOS 82  BILITOT 0.8  PROT 6.9  ALBUMIN 4.0   Recent Labs    04/27/23 0832 01/10/24 1504 01/18/24 0000  WBC 5.5 6.6 5.3  NEUTROABS  --  4.0 2,555.00  HGB 12.3* 12.7* 11.7*  HCT 37.0* 38.6* 36*  MCV 98.9 97.5  --   PLT 207 198 187   No results found for: TSH No results found for: HGBA1C No results found for: CHOL, HDL, LDLCALC, LDLDIRECT, TRIG, CHOLHDL  Significant Diagnostic Results in last 30 days:  No results found.  Assessment/Plan 1. Generalized weakness (Primary) - ED 09/14-09/16> workup unremarkable - discharged from PT 02/05/2024 - hoyer transfer - ambulates with w/c - no recent falls - cont skilled nursing   2. Essential hypertension - BUN/creat 25/0.7 01/18/2024 - lisinopril  30 mg discontinued  - controlled without medication, goal < 150/90  3. Neuropathy - cont gabapentin   4. Sick sinus syndrome (HCC) - s/p pacemaker 02/25/2017  5. Confusion - noted  10/09 per chart review - no behaviors - appropriate today - ST consulted for cognitive testing  - 04/27/2023 CT head noted mild chronic small vessel ischemic disease - no MMSE/  MOCA recorded   Family/ staff Communication: plan discussed with patient and nurse  Labs/tests ordered:  none

## 2024-02-16 LAB — BASIC METABOLIC PANEL WITH GFR
BUN: 20 (ref 4–21)
CO2: 23 — AB (ref 13–22)
Chloride: 105 (ref 99–108)
Glucose: 275
Potassium: 4.3 meq/L (ref 3.5–5.1)
Sodium: 138 (ref 137–147)

## 2024-02-16 LAB — COMPREHENSIVE METABOLIC PANEL WITH GFR
Albumin: 3.8 (ref 3.5–5.0)
Calcium: 8.7 (ref 8.7–10.7)
Globulin: 2.3

## 2024-02-16 LAB — CBC AND DIFFERENTIAL
HCT: 39 — AB (ref 41–53)
Hemoglobin: 12.6 — AB (ref 13.5–17.5)
WBC: 6.1

## 2024-02-16 LAB — CBC: RBC: 3.93 (ref 3.87–5.11)

## 2024-02-16 LAB — HEMOGLOBIN A1C: Hemoglobin A1C: 6.4

## 2024-02-16 LAB — HEPATIC FUNCTION PANEL
ALT: 15 U/L (ref 10–40)
AST: 18 (ref 14–40)

## 2024-02-16 LAB — LIPID PANEL
Cholesterol: 177 (ref 0–200)
HDL: 45 (ref 35–70)
LDL Cholesterol: 108
Triglycerides: 127 (ref 40–160)

## 2024-02-19 ENCOUNTER — Other Ambulatory Visit: Payer: Self-pay | Admitting: Orthopedic Surgery

## 2024-02-19 DIAGNOSIS — R410 Disorientation, unspecified: Secondary | ICD-10-CM

## 2024-03-09 ENCOUNTER — Non-Acute Institutional Stay (SKILLED_NURSING_FACILITY): Admitting: Orthopedic Surgery

## 2024-03-09 ENCOUNTER — Encounter: Payer: Self-pay | Admitting: Orthopedic Surgery

## 2024-03-09 DIAGNOSIS — I10 Essential (primary) hypertension: Secondary | ICD-10-CM | POA: Diagnosis not present

## 2024-03-09 DIAGNOSIS — I5032 Chronic diastolic (congestive) heart failure: Secondary | ICD-10-CM | POA: Diagnosis not present

## 2024-03-09 DIAGNOSIS — H6122 Impacted cerumen, left ear: Secondary | ICD-10-CM | POA: Diagnosis not present

## 2024-03-09 DIAGNOSIS — R531 Weakness: Secondary | ICD-10-CM | POA: Diagnosis not present

## 2024-03-09 DIAGNOSIS — G629 Polyneuropathy, unspecified: Secondary | ICD-10-CM

## 2024-03-09 DIAGNOSIS — Z950 Presence of cardiac pacemaker: Secondary | ICD-10-CM

## 2024-03-09 DIAGNOSIS — G3184 Mild cognitive impairment, so stated: Secondary | ICD-10-CM

## 2024-03-09 MED ORDER — DEBROX 6.5 % OT SOLN: 5.0000 [drp] | Freq: Two times a day (BID) | OTIC | Status: AC

## 2024-03-09 NOTE — Progress Notes (Signed)
Opened in error  This encounter was created in error - please disregard. 

## 2024-03-09 NOTE — Progress Notes (Signed)
 Location:  Other [facility] Room Number: [address] Place of Service:  SNF [phone]) Provider:  Greig Cluster, NP    Patient Care Team: Alla Amis, MD as PCP - General (Family Medicine)  Extended Emergency Contact Information Primary Emergency Contact: [facility] Address: [address]          [place], [place] [address] [place] Home Phone: [phone] Mobile Phone: [phone] Relation: Spouse  Code Status:  FULL CODE Goals of care: Advanced Directive information    01/10/2024    3:03 PM  Advanced Directives  Does Patient Have a Medical Advance Directive? No  Does patient want to make changes to medical advance directive? No - Patient declined     Chief Complaint  Patient presents with   Medical Management of Chronic Issues    Medical Management of Chronic Issues    HPI:  Pt is a 89 y.o. male seen today for medical management of chronic diseases.    He currently resides on the skilled nursing unit at [facility]. PMH: atherosclerosis, HTN, SSS s/p pacemaker, lung nodule, peripheral neuropathy, basal cell carcinoma, CKD stage II, renal calculi, prediabetes and anemia.    Admitted to SNF 09/16 due to generalized weakness. ED visit 09/14-09/16> lab work and UA unremarkable.    Generalized weakness- recently discharged from PT, hoyer transfer, skilled nursing recommended, ambulates with wheelchair HTN- BUN/creat 25/0.7 01/18/2024, lisinopril  held last ED visit, see pressures below HFrEF- LVEF 35%, moderate diastolic dysfunction with mild LDH, not on medication Pacemaker- placed 01/2017 Neuropathy- remains on gabapentin  Confusion- ST consulted for cognitive testing, recent BIMS score 15/15 01/15/2024, no behaviors  Aspirin stopped by cardiology 11/10.   Recent weights:  11/05 182.4 lbs 10/15- 179.4 lbs             09/16- 176 lbs   Recent blood pressures:  11/11- 139/78, 108/71  11/10- 136/72, 136/72  Past Medical History:   Diagnosis Date   Anemia    Bradycardia    Cancer (HCC)    skin   Dental bridge present    Permanent Bottom Right   Diabetes mellitus without complication (HCC)    diet controlled   Diverticulitis    osis   Heart murmur    History of kidney stones    HOH (hard of hearing)    Hypertension    Kidney stones    Presence of permanent cardiac pacemaker 02/25/2017   [employer] Azure XT DR [id number]   Prostate disorder    Transfusion history    Past Surgical History:  Procedure Laterality Date   CATARACT EXTRACTION W/PHACO Left 08/10/2019   Procedure: CATARACT EXTRACTION PHACO AND INTRAOCULAR LENS PLACEMENT (IOC) LEFT DIABETIC 9.74  01:16.4  12.7% ;  Surgeon: Mittie Gaskin, MD;  Location: [facility];  Service: Ophthalmology;  Laterality: Left;  Diabetic - diet controlled   CATARACT EXTRACTION W/PHACO Right 08/31/2019   Procedure: CATARACT EXTRACTION PHACO AND INTRAOCULAR LENS PLACEMENT (IOC) RIGHT DIABETIC;  Surgeon: Mittie Gaskin, MD;  Location: [facility];  Service: Ophthalmology;  Laterality: Right;  10.94 1:03.4 17.3%   COLONOSCOPY     HEMORROIDECTOMY     HERNIA REPAIR     PACEMAKER INSERTION N/A 02/25/2017   Procedure: INSERTION PACEMAKER;  Surgeon: Ammon Blunt, MD;  Location: [facility];  Service: Cardiovascular;  Laterality: N/A;   penile resection     PILONIDAL CYST EXCISION     PROSTATE SURGERY      No Known Allergies  Outpatient Encounter Medications  as of 03/09/2024  Medication Sig   acetaminophen  (TYLENOL ) 500 MG tablet Take 500 mg by mouth every 6 (six) hours as needed.   Calcium Citrate-Vitamin D (CITRACAL PETITES/VITAMIN D) 200-250 MG-UNIT TABS Take 1 tablet by mouth 2 (two) times daily.   gabapentin  (NEURONTIN ) 100 MG capsule Take 100 mg by mouth daily. Take 3 tablets by mouth at bedtime.   Magnesium 250 MG TABS Take 1 tablet by mouth daily.   Multiple Vitamins-Minerals (CENTRUM SILVER ULTRA MENS PO) Take 1 tablet by mouth  daily.   ondansetron  (ZOFRAN -ODT) 4 MG disintegrating tablet Take 1 tablet (4 mg total) by mouth every 8 (eight) hours as needed for nausea or vomiting.   psyllium (METAMUCIL) 58.6 % packet Take 1 packet by mouth daily.   aspirin 81 MG EC tablet Take 81 mg by mouth daily. (Patient not taking: Reported on 03/09/2024)   No facility-administered encounter medications on file as of 03/09/2024.    Review of Systems  Constitutional: Negative.   HENT:  Positive for hearing loss. Negative for trouble swallowing.   Respiratory:  Negative for cough and shortness of breath.   Cardiovascular:  Negative for chest pain and leg swelling.  Gastrointestinal:  Negative for abdominal distention and abdominal pain.  Genitourinary:  Negative for dysuria and hematuria.  Musculoskeletal:  Positive for gait problem.  Skin:  Negative for wound.  Neurological:  Positive for weakness. Negative for dizziness.  Psychiatric/Behavioral:  Positive for confusion. Negative for dysphoric mood and sleep disturbance. The patient is not nervous/anxious.     Immunization History  Administered Date(s) Administered   INFLUENZA, HIGH DOSE SEASONAL PF 01/29/2017, 02/08/2019, 02/06/2021   Influenza,trivalent, recombinat, inj, PF 01/27/2018   Influenza-Unspecified 03/31/2012, 02/14/2013, 02/15/2015, 02/04/2016, 01/29/2017, 02/20/2020, 02/09/2024   PFIZER Comirnaty(Gray Top)Covid-19 Tri-Sucrose Vaccine 06/11/2019, 01/11/2020, 08/15/2020   PFIZER(Purple Top)SARS-COV-2 Vaccination 05/21/2019   Pfizer Covid-19 Vaccine Bivalent Booster 23yrs & up 02/06/2021   Pfizer(Comirnaty)Fall Seasonal Vaccine 12 years and older 03/05/2022   Pneumococcal Conjugate-13 06/15/2014   Pneumococcal Polysaccharide-23 05/13/2012   Tdap 11/14/2020   Unspecified SARS-COV-2 Vaccination 04/12/2023   Zoster Recombinant(Shingrix) 06/26/2017, 09/16/2017, 02/16/2018   Pertinent  Health Maintenance Due  Topic Date Due   Influenza Vaccine  Completed       08/10/2019    8:29 AM 08/31/2019    7:13 AM 09/17/2019    7:08 PM 10/30/2019   12:57 PM 02/12/2024    2:50 PM  Fall Risk  Falls in the past year?     1  Was there an injury with Fall?     0  Fall Risk Category Calculator     1  (RETIRED) Patient Fall Risk Level Moderate fall risk  Moderate fall risk  Low fall risk  Low fall risk    Patient at Risk for Falls Due to     Impaired balance/gait  Fall risk Follow up     Falls evaluation completed     Data saved with a previous flowsheet row definition   Functional Status Survey:    Vitals:   03/09/24 1000  BP: 139/78  Pulse: 61  Resp: 15  Temp: 97.6 F (36.4 C)  SpO2: 97%  Weight: 182 lb 6.4 oz (82.7 kg)  Height: 6' 1 (1.854 m)   Body mass index is 24.06 kg/m. Physical Exam Vitals reviewed.  Constitutional:      General: He is not in acute distress. HENT:     Head: Normocephalic.     Right Ear: There is no impacted  cerumen.     Left Ear: There is impacted cerumen.     Nose: Nose normal.     Mouth/Throat:     Mouth: Mucous membranes are moist.  Eyes:     General:        Right eye: No discharge.        Left eye: No discharge.  Cardiovascular:     Rate and Rhythm: Normal rate and regular rhythm.     Pulses: Normal pulses.     Heart sounds: Normal heart sounds.  Pulmonary:     Effort: Pulmonary effort is normal.     Breath sounds: Normal breath sounds.  Abdominal:     General: Bowel sounds are normal. There is no distension.     Palpations: Abdomen is soft.     Tenderness: There is no abdominal tenderness.  Musculoskeletal:     Cervical back: Neck supple.     Right lower leg: No edema.     Left lower leg: No edema.  Skin:    General: Skin is warm.     Capillary Refill: Capillary refill takes less than 2 seconds.  Neurological:     General: No focal deficit present.     Mental Status: He is alert. Mental status is at baseline.     Gait: Gait abnormal.  Psychiatric:     Comments: Delayed responses but appropriate      Labs reviewed: Recent Labs    04/27/23 0832 01/10/24 1504 01/18/24 0000  NA 139 139 143  K 4.1 4.0 4.3  CL 103 105 108  CO2 23 25 30*  GLUCOSE 136* 128*  --   BUN 21 26* 25*  CREATININE 0.62 0.59* 0.7  CALCIUM 9.2 8.9 8.8   Recent Labs    01/10/24 1504  AST 27  ALT 20  ALKPHOS 82  BILITOT 0.8  PROT 6.9  ALBUMIN 4.0   Recent Labs    04/27/23 0832 01/10/24 1504 01/18/24 0000  WBC 5.5 6.6 5.3  NEUTROABS  --  4.0 2,555.00  HGB 12.3* 12.7* 11.7*  HCT 37.0* 38.6* 36*  MCV 98.9 97.5  --   PLT 207 198 187   No results found for: TSH No results found for: HGBA1C No results found for: CHOL, HDL, LDLCALC, LDLDIRECT, TRIG, CHOLHDL  Significant Diagnostic Results in last 30 days:  No results found.  Assessment/Plan 1. Left ear impacted cerumen (Primary) - carbamide peroxide (DEBROX) 6.5 % OTIC solution; Place 5 drops into the left ear 2 (two) times daily for 5 days. - flush ears when debrox complete  2. Generalized weakness - ongoing - ambulates with w/c - ? Parkinsonism> family history with father  - cont PT/OT  3. Essential hypertension - controlled without medication  4. Chronic diastolic heart failure (HCC) - followed by cardiology - appears compensated - not on diuretics  5. Pacemaker S/p placement 01/2017 d/t bradycardia  6. Neuropathy - cont gabapentin   7. Mild cognitive impairment - recent BIMS 15/15 - delayed appropriate responses - needing more assistance with ADLs    Family/ staff Communication: plan discussed with patient   Labs/tests ordered:  none

## 2024-04-14 ENCOUNTER — Non-Acute Institutional Stay (SKILLED_NURSING_FACILITY): Payer: Self-pay | Admitting: Internal Medicine

## 2024-04-14 DIAGNOSIS — I5022 Chronic systolic (congestive) heart failure: Secondary | ICD-10-CM

## 2024-04-14 DIAGNOSIS — Z993 Dependence on wheelchair: Secondary | ICD-10-CM | POA: Diagnosis not present

## 2024-04-14 DIAGNOSIS — G629 Polyneuropathy, unspecified: Secondary | ICD-10-CM | POA: Diagnosis not present

## 2024-04-14 DIAGNOSIS — Z950 Presence of cardiac pacemaker: Secondary | ICD-10-CM

## 2024-04-14 DIAGNOSIS — I495 Sick sinus syndrome: Secondary | ICD-10-CM | POA: Diagnosis not present

## 2024-04-14 DIAGNOSIS — I10 Essential (primary) hypertension: Secondary | ICD-10-CM | POA: Diagnosis not present

## 2024-04-14 DIAGNOSIS — N182 Chronic kidney disease, stage 2 (mild): Secondary | ICD-10-CM | POA: Diagnosis not present

## 2024-04-14 NOTE — Assessment & Plan Note (Signed)
 Neurology is tapering him off of Neurontin .

## 2024-04-14 NOTE — Assessment & Plan Note (Signed)
 Chronic.  Status post pacemaker.

## 2024-04-14 NOTE — Assessment & Plan Note (Signed)
 Stable.  Insertion for sick sinus syndrome.  October 2018.

## 2024-04-14 NOTE — Assessment & Plan Note (Signed)
 Patient is wheelchair dependent now.  He is in restorative therapy.  He is no longer and skilled physical therapy.  He is a long-term resident now at [facility].

## 2024-04-14 NOTE — Assessment & Plan Note (Signed)
 Stable. He is not on any medications for hypertension at this point.

## 2024-04-14 NOTE — Assessment & Plan Note (Signed)
 Stable.  Last creatinine of 0.7 of September 2025.

## 2024-04-14 NOTE — Progress Notes (Signed)
 [facility] SN[facility] Routine Visit Progress Note    Location:  Other [facility]) Nursing Home Room Number: [address]A Place of Service:  SNF (31) ([place])   Laurence Locus, DO    Patient Care Team: Alla Amis, MD as PCP - General (Family Medicine)   Extended Emergency Contact Information Primary Emergency Contact: [facility] Address: [address]          [place], [place] [address] [place] Home Phone: [phone] Mobile Phone: [phone] Relation: Spouse   Goals of care: Advanced Directive information    04/14/2024    8:56 AM  Advanced Directives  Does Patient Have a Medical Advance Directive? No  Would patient like information on creating a medical advance directive? No - Patient declined    CODE STATUS: Full Code   Chief Complaint  Patient presents with   Medical Management of Chronic Issues    Routine Visit,     HPI: Pt is a 89 y.o. male seen today for medical management of chronic disease.  Patient is a 89 year old male who was originally admitted to [facility] on February 12, 2023 for long-term care.  He has a history of CKD stage II, sick sinus syndrome, hypertension, history of pacemaker placement, peripheral neuropathy, prediabetes.  He ambulates with the use of a wheelchair.  He is a long-term resident now.  He has a history of systolic heart failure followed by cardiology with an LVEF of 35% by echo at [facility] in February 2024.  He is no longer getting skilled physical therapy but is participating in restorative therapy.  Since his last routine visit, he was seen by neurology by Dr. Darlyn Farrow with [facility] neurology.  His EMG done in March 2025 showed severe sensorimotor probably neuropathy of the lower extremities.  Neurology wrote to taper him off of gabapentin  to reduce his confusion that he has had recently.  They are considering starting him on Sinemet.  They are going to continue to monitor for the  emergence or progression of symptoms consistent with Parkinson's disease.  They want to see him back in clinic with Dr. Farrow in 1 to 2 months.  He also had a cardiology appointment March 07, 2024.  This was for follow-up of his bradycardia and pacemaker implantation back in February 25, 2017.  During this office visit, his wife Levorn was present.  During this visit, the cardiologist agreed that stopping his lisinopril  and aspirin was appropriate.  Cardiology did not feel that there was an indication for anticoagulation.  He did not want to uptitrate his guideline directed medical therapy for his reduced LVEF as this would likely not change his disease state.  Patient seen in the TV room sitting in a recliner.  He states that the recliner he is sitting is very comfortable and he likes it.  He has no general health concerns.  He is aware that his wife is staying away from [facility] as there is a current influenza A outbreak among the residents.  I personally called the wife and asked if she had any concerns about his health.  She denied any health concerns.  I further brought up that the patient currently is still a full code.  I asked her to discuss this between herself and her children if FULL CODE STATUS this is something that Mr. Habenicht would continue to want.  Past Medical History:  Diagnosis Date   Anemia    Bradycardia    Cancer (HCC)  skin   Dental bridge present    Permanent Bottom Right   Diabetes mellitus without complication (HCC)    diet controlled   Diverticulitis    osis   Heart murmur    Hematuria 01/22/2024   Seen in the ED 9/14 for generalized weakness, greatest in the lower extremities.  Urinalysis revealed large amount of hemoglobin with negative nitrites and negative leukocytes.  H/H 12.7/38.6.  Past history of nephrolithiasis.     History of kidney stones    History of nephrolithiasis 06/30/2019   HOH (hard of hearing)    Hypertension    Kidney stones     Presence of permanent cardiac pacemaker 02/25/2017   [employer] Azure XT DR [id number]   Prostate disorder    Transfusion history    Past Surgical History:  Procedure Laterality Date   CATARACT EXTRACTION W/PHACO Left 08/10/2019   Procedure: CATARACT EXTRACTION PHACO AND INTRAOCULAR LENS PLACEMENT (IOC) LEFT DIABETIC 9.74  01:16.4  12.7% ;  Surgeon: Mittie Gaskin, MD;  Location: [facility];  Service: Ophthalmology;  Laterality: Left;  Diabetic - diet controlled   CATARACT EXTRACTION W/PHACO Right 08/31/2019   Procedure: CATARACT EXTRACTION PHACO AND INTRAOCULAR LENS PLACEMENT (IOC) RIGHT DIABETIC;  Surgeon: Mittie Gaskin, MD;  Location: [facility];  Service: Ophthalmology;  Laterality: Right;  10.94 1:03.4 17.3%   COLONOSCOPY     HEMORROIDECTOMY     HERNIA REPAIR     PACEMAKER INSERTION N/A 02/25/2017   Procedure: INSERTION PACEMAKER;  Surgeon: Ammon Blunt, MD;  Location: [facility];  Service: Cardiovascular;  Laterality: N/A;   penile resection     PILONIDAL CYST EXCISION     PROSTATE SURGERY       Allergies[1]   Outpatient Encounter Medications as of 04/14/2024  Medication Sig   acetaminophen  (TYLENOL ) 500 MG tablet Take 500 mg by mouth every 6 (six) hours as needed.   Calcium Citrate-Vitamin D (CITRACAL PETITES/VITAMIN D) 200-250 MG-UNIT TABS Take 1 tablet by mouth 2 (two) times daily.   gabapentin  (NEURONTIN ) 100 MG capsule Take 100 mg by mouth daily. Take 3 tablets by mouth at bedtime.   Magnesium 250 MG TABS Take 1 tablet by mouth daily.   Multiple Vitamins-Minerals (CENTRUM SILVER ULTRA MENS PO) Take 1 tablet by mouth daily.   ondansetron  (ZOFRAN -ODT) 4 MG disintegrating tablet Take 1 tablet (4 mg total) by mouth every 8 (eight) hours as needed for nausea or vomiting.   psyllium (METAMUCIL) 58.6 % packet Take 1 packet by mouth daily.   No facility-administered encounter medications on file as of 04/14/2024.     Review of Systems   Constitutional: Negative.   HENT: Negative.    Eyes: Negative.   Respiratory: Negative.    Cardiovascular: Negative.   Endocrine: Negative.   Genitourinary: Negative.   Allergic/Immunologic: Negative.   Neurological:        Essentially wheelchair-bound now.  Hematological: Negative.   Psychiatric/Behavioral: Negative.    All other systems reviewed and are negative.     Immunization History  Administered Date(s) Administered   INFLUENZA, HIGH DOSE SEASONAL PF 01/29/2017, 02/08/2019, 02/06/2021   Influenza,trivalent, recombinat, inj, PF 01/27/2018   Influenza-Unspecified 03/31/2012, 02/14/2013, 02/15/2015, 02/04/2016, 01/29/2017, 02/20/2020, 02/09/2024   PFIZER Comirnaty(Gray Top)Covid-19 Tri-Sucrose Vaccine 06/11/2019, 01/11/2020, 08/15/2020   PFIZER(Purple Top)SARS-COV-2 Vaccination 05/21/2019   Pfizer Covid-19 Vaccine Bivalent Booster 12yrs & up 02/06/2021   Pfizer(Comirnaty)Fall Seasonal Vaccine 12 years and older 03/05/2022   Pneumococcal Conjugate-13 06/15/2014   Pneumococcal Polysaccharide-23 05/13/2012   Tdap 11/14/2020  Unspecified SARS-COV-2 Vaccination 04/12/2023   Zoster Recombinant(Shingrix) 06/26/2017, 09/16/2017, 02/16/2018   Pertinent  Health Maintenance Due  Topic Date Due   Influenza Vaccine  Completed      08/31/2019    7:13 AM 09/17/2019    7:08 PM 10/30/2019   12:57 PM 02/12/2024    2:50 PM 03/09/2024   12:39 PM  Fall Risk  Falls in the past year?    1 1  Was there an injury with Fall?    0  0   Fall Risk Category Calculator    1 1  (RETIRED) Patient Fall Risk Level Moderate fall risk  Low fall risk  Low fall risk     Patient at Risk for Falls Due to    Impaired balance/gait History of fall(s)  Fall risk Follow up    Falls evaluation completed Falls evaluation completed     Data saved with a previous flowsheet row definition   Functional Status Survey:     Vitals:   04/14/24 0848  BP: 135/72  Pulse: 68  Resp: 18  Temp: 97.8 F (36.6 C)   SpO2: 95%  Weight: 180 lb 6.4 oz (81.8 kg)  Height: 6' 1 (1.854 m)   Body mass index is 23.8 kg/m. Physical Exam Vitals and nursing note reviewed.  Constitutional:      General: He is not in acute distress.    Appearance: He is normal weight. He is not toxic-appearing.  HENT:     Head: Normocephalic and atraumatic.  Eyes:     General: No scleral icterus. Cardiovascular:     Rate and Rhythm: Normal rate and regular rhythm.  Abdominal:     General: Abdomen is flat.     Palpations: Abdomen is soft.  Musculoskeletal:     Right lower leg: No edema.     Left lower leg: No edema.  Skin:    General: Skin is warm and dry.     Capillary Refill: Capillary refill takes less than 2 seconds.  Neurological:     Mental Status: He is alert.      Labs reviewed: Recent Labs    04/27/23 0832 01/10/24 1504 01/18/24 0000 02/16/24 0000  NA 139 139 143 138  K 4.1 4.0 4.3 4.3  CL 103 105 108 105  CO2 23 25 30* 23*  GLUCOSE 136* 128*  --   --   BUN 21 26* 25* 20  CREATININE 0.62 0.59* 0.7  --   CALCIUM 9.2 8.9 8.8 8.7   Recent Labs    01/10/24 1504 02/16/24 0000  AST 27 18  ALT 20 15  ALKPHOS 82  --   BILITOT 0.8  --   PROT 6.9  --   ALBUMIN 4.0 3.8   Recent Labs    04/27/23 0832 01/10/24 1504 01/18/24 0000 02/16/24 0000  WBC 5.5 6.6 5.3 6.1  NEUTROABS  --  4.0 2,555.00  --   HGB 12.3* 12.7* 11.7* 12.6*  HCT 37.0* 38.6* 36* 39*  MCV 98.9 97.5  --   --   PLT 207 198 187  --     Lab Results  Component Value Date   HGBA1C 6.4 02/16/2024   Lab Results  Component Value Date   CHOL 177 02/16/2024   HDL 45 02/16/2024   LDLCALC 108 02/16/2024   TRIG 127 02/16/2024       Assessment/Plan Wheelchair dependent Patient is wheelchair dependent now.  He is in restorative therapy.  He is no longer and skilled  physical therapy.  He is a long-term resident now at [facility].  Chronic systolic CHF (congestive heart failure) (HCC) Seen by cardiology last month.  They  do not feel that he needs guideline directed medical therapy.  They are happy with his stable EF and pacemaker.  Chronic kidney disease, stage II (mild) Stable.  Last creatinine of 0.7 of September 2025.  Sick sinus syndrome (HCC) Chronic.  Status post pacemaker.  Essential hypertension Stable. He is not on any medications for hypertension at this point.  Peripheral neuropathy Neurology is tapering him off of Neurontin .  Status post placement of cardiac pacemaker Stable.  Insertion for sick sinus syndrome.  October 2018.   Family Communication: I called pt's wife Srihari Shellhammer and asked her and pt's children to consider DNR/DNI status for patient. Wife said her children are coming into town for Christmas holiday and she will speak to them about pt's Code Status.  Camellia Door, DO [facility] [phone]      [1] No Known Allergies

## 2024-04-14 NOTE — Assessment & Plan Note (Signed)
 Seen by cardiology last month.  They do not feel that he needs guideline directed medical therapy.  They are happy with his stable EF and pacemaker.

## 2024-04-19 ENCOUNTER — Encounter: Payer: Self-pay | Admitting: Nurse Practitioner

## 2024-04-19 ENCOUNTER — Non-Acute Institutional Stay (SKILLED_NURSING_FACILITY): Payer: Self-pay | Admitting: Nurse Practitioner

## 2024-04-19 DIAGNOSIS — T148XXA Other injury of unspecified body region, initial encounter: Secondary | ICD-10-CM | POA: Diagnosis not present

## 2024-04-19 DIAGNOSIS — L089 Local infection of the skin and subcutaneous tissue, unspecified: Secondary | ICD-10-CM

## 2024-04-19 NOTE — Progress Notes (Signed)
 " Location:  Other [place].  Nursing Home Room Number: [facility] Place of Service:  SNF (31) Harlene An, NP  PCP: Alla Amis, MD  Patient Care Team: Alla Amis, MD as PCP - General [facility] Medicine)  Extended Emergency Contact Information Primary Emergency Contact: [facility] Address: [address]          [place], [place] [address] [place] Home Phone: [phone] Mobile Phone: [phone] Relation: Spouse  Goals of care: Advanced Directive information    04/14/2024    8:56 AM  Advanced Directives  Does Patient Have a Medical Advance Directive? No  Would patient like information on creating a medical advance directive? No - Patient declined     Chief Complaint  Patient presents with   Wound    Wound    HPI:  Pt is a 89 y.o. male seen today for an acute visit for wound Pt with a chronic pressure sacral wound that is being dressed with calcium and proximal dressing. Nursing over the weekend noted increase eschar and request evaluation today.  Wound care doctor planning on seeing patient next week.  Pt without fever or chills.    Past Medical History:  Diagnosis Date   Anemia    Bradycardia    Cancer (HCC)    skin   Dental bridge present    Permanent Bottom Right   Diabetes mellitus without complication (HCC)    diet controlled   Diverticulitis    osis   Heart murmur    Hematuria 01/22/2024   Seen in the ED 9/14 for generalized weakness, greatest in the lower extremities.  Urinalysis revealed large amount of hemoglobin with negative nitrites and negative leukocytes.  H/H 12.7/38.6.  Past history of nephrolithiasis.     History of kidney stones    History of nephrolithiasis 06/30/2019   HOH (hard of hearing)    Hypertension    Kidney stones    Presence of permanent cardiac pacemaker 02/25/2017   [employer] Azure XT DR [id number]   Prostate disorder    Transfusion history    Past Surgical History:   Procedure Laterality Date   CATARACT EXTRACTION W/PHACO Left 08/10/2019   Procedure: CATARACT EXTRACTION PHACO AND INTRAOCULAR LENS PLACEMENT (IOC) LEFT DIABETIC 9.74  01:16.4  12.7% ;  Surgeon: Mittie Gaskin, MD;  Location: [facility];  Service: Ophthalmology;  Laterality: Left;  Diabetic - diet controlled   CATARACT EXTRACTION W/PHACO Right 08/31/2019   Procedure: CATARACT EXTRACTION PHACO AND INTRAOCULAR LENS PLACEMENT (IOC) RIGHT DIABETIC;  Surgeon: Mittie Gaskin, MD;  Location: [facility];  Service: Ophthalmology;  Laterality: Right;  10.94 1:03.4 17.3%   COLONOSCOPY     HEMORROIDECTOMY     HERNIA REPAIR     PACEMAKER INSERTION N/A 02/25/2017   Procedure: INSERTION PACEMAKER;  Surgeon: Ammon Blunt, MD;  Location: [facility] ORS;  Service: Cardiovascular;  Laterality: N/A;   penile resection     PILONIDAL CYST EXCISION     PROSTATE SURGERY      Allergies[1]  Outpatient Encounter Medications as of 04/19/2024  Medication Sig   acetaminophen  (TYLENOL ) 325 MG tablet Take 650 mg by mouth 2 (two) times daily.   acetaminophen  (TYLENOL ) 500 MG tablet Take 500 mg by mouth every 6 (six) hours as needed.   Calcium Citrate-Vitamin D (CITRACAL PETITES/VITAMIN D) 200-250 MG-UNIT TABS Take 1 tablet by mouth 2 (two) times daily.   gabapentin  (NEURONTIN ) 100 MG capsule Take 100 mg by mouth daily. Take 3 tablets by mouth  at bedtime.   Magnesium 250 MG TABS Take 1 tablet by mouth daily.   Multiple Vitamins-Minerals (CENTRUM SILVER ULTRA MENS PO) Take 1 tablet by mouth daily.   ondansetron  (ZOFRAN -ODT) 4 MG disintegrating tablet Take 1 tablet (4 mg total) by mouth every 8 (eight) hours as needed for nausea or vomiting.   psyllium (METAMUCIL) 58.6 % packet Take 1 packet by mouth daily.   No facility-administered encounter medications on file as of 04/19/2024.    Review of Systems  Unable to perform ROS: Dementia    Immunization History  Administered Date(s)  Administered   INFLUENZA, HIGH DOSE SEASONAL PF 01/29/2017, 02/08/2019, 02/06/2021   Influenza,trivalent, recombinat, inj, PF 01/27/2018   Influenza-Unspecified 03/31/2012, 02/14/2013, 02/15/2015, 02/04/2016, 01/29/2017, 02/20/2020, 02/09/2024   PFIZER Comirnaty(Gray Top)Covid-19 Tri-Sucrose Vaccine 06/11/2019, 01/11/2020, 08/15/2020   PFIZER(Purple Top)SARS-COV-2 Vaccination 05/21/2019   Pfizer Covid-19 Vaccine Bivalent Booster 32yrs & up 02/06/2021   Pfizer(Comirnaty)Fall Seasonal Vaccine 12 years and older 03/05/2022   Pneumococcal Conjugate-13 06/15/2014   Pneumococcal Polysaccharide-23 05/13/2012   Tdap 11/14/2020   Unspecified SARS-COV-2 Vaccination 04/12/2023, 03/30/2024   Zoster Recombinant(Shingrix) 06/26/2017, 09/16/2017, 02/16/2018   Pertinent  Health Maintenance Due  Topic Date Due   Influenza Vaccine  Completed      08/31/2019    7:13 AM 09/17/2019    7:08 PM 10/30/2019   12:57 PM 02/12/2024    2:50 PM 03/09/2024   12:39 PM  Fall Risk  Falls in the past year?    1 1  Was there an injury with Fall?    0  0   Fall Risk Category Calculator    1 1  (RETIRED) Patient Fall Risk Level Moderate fall risk  Low fall risk  Low fall risk     Patient at Risk for Falls Due to    Impaired balance/gait History of fall(s)  Fall risk Follow up    Falls evaluation completed Falls evaluation completed     Data saved with a previous flowsheet row definition   Functional Status Survey:    Vitals:   04/19/24 1002  BP: (!) 143/70  Pulse: 68  Resp: 18  Temp: 97.6 F (36.4 C)  SpO2: 95%  Weight: 180 lb 6.4 oz (81.8 kg)  Height: 6' 1 (1.854 m)   Body mass index is 23.8 kg/m. Physical Exam Constitutional:      Appearance: Normal appearance.  Cardiovascular:     Rate and Rhythm: Normal rate and regular rhythm.  Neurological:     Mental Status: He is alert. Mental status is at baseline.  Psychiatric:        Mood and Affect: Mood normal.     Labs reviewed: Recent Labs     04/27/23 0832 01/10/24 1504 01/18/24 0000 02/16/24 0000  NA 139 139 143 138  K 4.1 4.0 4.3 4.3  CL 103 105 108 105  CO2 23 25 30* 23*  GLUCOSE 136* 128*  --   --   BUN 21 26* 25* 20  CREATININE 0.62 0.59* 0.7  --   CALCIUM 9.2 8.9 8.8 8.7   Recent Labs    01/10/24 1504 02/16/24 0000  AST 27 18  ALT 20 15  ALKPHOS 82  --   BILITOT 0.8  --   PROT 6.9  --   ALBUMIN 4.0 3.8   Recent Labs    04/27/23 0832 01/10/24 1504 01/18/24 0000 02/16/24 0000  WBC 5.5 6.6 5.3 6.1  NEUTROABS  --  4.0 2,555.00  --   HGB  12.3* 12.7* 11.7* 12.6*  HCT 37.0* 38.6* 36* 39*  MCV 98.9 97.5  --   --   PLT 207 198 187  --    No results found for: TSH Lab Results  Component Value Date   HGBA1C 6.4 02/16/2024   Lab Results  Component Value Date   CHOL 177 02/16/2024   HDL 45 02/16/2024   LDLCALC 108 02/16/2024   TRIG 127 02/16/2024    Significant Diagnostic Results in last 30 days:  No results found.  Assessment/Plan 1. Infected wound (Primary) Redness noted around sacral pressure ulcer with warm and tenderness.  Will add doxycyline 100 mg by mouth twice daily for 14 days with flagyl 500 mg by mouth twice daily for 14 days Probiotic twice daily due to antibiotic use Due to eschar will start santyl daily with calcium and proximal dressing Staff to notify for worsening of redness, pain or drainage. To keep wound care doctor appt.     Aminat Shelburne K. Caro BODILY [facility] [phone]       [1] No Known Allergies  "

## 2024-05-06 ENCOUNTER — Encounter: Payer: Self-pay | Admitting: Orthopedic Surgery

## 2024-05-06 ENCOUNTER — Non-Acute Institutional Stay (SKILLED_NURSING_FACILITY): Payer: Self-pay | Admitting: Orthopedic Surgery

## 2024-05-06 DIAGNOSIS — L89154 Pressure ulcer of sacral region, stage 4: Secondary | ICD-10-CM

## 2024-05-06 DIAGNOSIS — I10 Essential (primary) hypertension: Secondary | ICD-10-CM

## 2024-05-06 DIAGNOSIS — I5032 Chronic diastolic (congestive) heart failure: Secondary | ICD-10-CM | POA: Diagnosis not present

## 2024-05-06 DIAGNOSIS — G629 Polyneuropathy, unspecified: Secondary | ICD-10-CM

## 2024-05-06 DIAGNOSIS — R7303 Prediabetes: Secondary | ICD-10-CM | POA: Diagnosis not present

## 2024-05-06 DIAGNOSIS — H6123 Impacted cerumen, bilateral: Secondary | ICD-10-CM | POA: Diagnosis not present

## 2024-05-06 DIAGNOSIS — Z950 Presence of cardiac pacemaker: Secondary | ICD-10-CM | POA: Diagnosis not present

## 2024-05-06 DIAGNOSIS — G3184 Mild cognitive impairment, so stated: Secondary | ICD-10-CM | POA: Diagnosis not present

## 2024-05-06 DIAGNOSIS — R531 Weakness: Secondary | ICD-10-CM | POA: Diagnosis not present

## 2024-05-06 NOTE — Progress Notes (Signed)
 " Location:  Other [facility]) Nursing Home Room Number: [place] [address] Place of Service:  SNF [phone]) Provider:  Greig Cluster, NP   Patient Care Team: Alla Amis, MD as PCP - General (Family Medicine)  Extended Emergency Contact Information Primary Emergency Contact: [facility] Address: [address]          [place], [place] [address] [place] Home Phone: [phone] Mobile Phone: [phone] Relation: Spouse  Code Status:  CPR Full Code Goals of care: Advanced Directive information    04/14/2024    8:56 AM  Advanced Directives  Does Patient Have a Medical Advance Directive? No  Would patient like information on creating a medical advance directive? No - Patient declined     Chief Complaint  Patient presents with   Routine Visit    HPI:  Pt is a 89 y.o. male seen today for medical management of chronic diseases.    He currently resides on the skilled nursing unit at [facility]. PMH: atherosclerosis, HTN, SSS s/p pacemaker, lung nodule, peripheral neuropathy, basal cell carcinoma, CKD stage II, renal calculi, prediabetes and anemia.    Admitted to SNF 09/16 due to generalized weakness. ED visit 09/14-09/16> lab work and UA unremarkable.    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, followed by wound, 01/08 debridement> Santyl stopped> Medihoney and calcium alginate started, completed doxycycline x 14 days and Prostat Generalized weakness- recently discharged from PT, hoyer transfer, skilled nursing/ LTC recommended, ambulates with wheelchair, no recent falls HTN- BUN/creat 20/0.65 05/16/2023, not on medication, lisinopril  discontinued per ED HFrEF- LVEF 35%, moderate diastolic dysfunction with mild LDH, not on medication Pacemaker- placed 01/2017 Neuropathy- remains on gabapentin  Prediabetes- A1c 6.4 02/16/2024, diet controlled  Confusion- ST consulted for cognitive testing, recent BIMS score 15/15 01/15/2024, no behaviors  Recent weights:  01/07-  185.9 lbs  12/03- 182.2 lbs  11/05- 182.4 lbs  Recent blood pressures:  01/09- 116/66  01/08- 151/71  01/07- 125/73, 110/66  Past Medical History:  Diagnosis Date   Anemia    Bradycardia    Cancer (HCC)    skin   Dental bridge present    Permanent Bottom Right   Diabetes mellitus without complication (HCC)    diet controlled   Diverticulitis    osis   Heart murmur    Hematuria 01/22/2024   Seen in the ED 9/14 for generalized weakness, greatest in the lower extremities.  Urinalysis revealed large amount of hemoglobin with negative nitrites and negative leukocytes.  H/H 12.7/38.6.  Past history of nephrolithiasis.     History of kidney stones    History of nephrolithiasis 06/30/2019   HOH (hard of hearing)    Hypertension    Kidney stones    Presence of permanent cardiac pacemaker 02/25/2017   [employer] Azure XT DR [id number]   Prostate disorder    Transfusion history    Past Surgical History:  Procedure Laterality Date   CATARACT EXTRACTION W/PHACO Left 08/10/2019   Procedure: CATARACT EXTRACTION PHACO AND INTRAOCULAR LENS PLACEMENT (IOC) LEFT DIABETIC 9.74  01:16.4  12.7% ;  Surgeon: Mittie Gaskin, MD;  Location: [facility];  Service: Ophthalmology;  Laterality: Left;  Diabetic - diet controlled   CATARACT EXTRACTION W/PHACO Right 08/31/2019   Procedure: CATARACT EXTRACTION PHACO AND INTRAOCULAR LENS PLACEMENT (IOC) RIGHT DIABETIC;  Surgeon: Mittie Gaskin, MD;  Location: [facility];  Service: Ophthalmology;  Laterality: Right;  10.94 1:03.4 17.3%   COLONOSCOPY     HEMORROIDECTOMY     HERNIA  REPAIR     PACEMAKER INSERTION N/A 02/25/2017   Procedure: INSERTION PACEMAKER;  Surgeon: Ammon Blunt, MD;  Location: [facility] OR[facility];  Service: Cardiovascular;  Laterality: N/A;   penile resection     PILONIDAL CYST EXCISION     PROSTATE SURGERY      Allergies[1]  Outpatient Encounter Medications as of 05/06/2024  Medication Sig   acetaminophen   (TYLENOL ) 325 MG tablet Take 650 mg by mouth 2 (two) times daily.   acetaminophen  (TYLENOL ) 500 MG tablet Take 500 mg by mouth every 6 (six) hours as needed.   Calcium Citrate-Vitamin D (CITRACAL PETITES/VITAMIN D) 200-250 MG-UNIT TABS Take 1 tablet by mouth 2 (two) times daily.   gabapentin  (NEURONTIN ) 100 MG capsule Take 100 mg by mouth daily. Take 3 tablets by mouth at bedtime.   Magnesium 250 MG TABS Take 1 tablet by mouth daily.   Multiple Vitamins-Minerals (CENTRUM SILVER ULTRA MENS PO) Take 1 tablet by mouth daily.   ondansetron  (ZOFRAN -ODT) 4 MG disintegrating tablet Take 1 tablet (4 mg total) by mouth every 8 (eight) hours as needed for nausea or vomiting.   psyllium (METAMUCIL) 58.6 % packet Take 1 packet by mouth daily.   Wound Dressings (MEDIHONEY WOUND &BURN DRESSING) PSTE Apply topically as directed. Apply to sacral/coccyx wound topically as needed for wound care Stage 4 pressure injury to sacrum/coccyx: Clean with generic wound cleaner and pat dry. Apply medihoney to wound bed and cover/pack with calcium alginate silver and proximel dressing. Change daily and PRN if soiled/ saturated or removed.   Wound Dressings (MEDIHONEY WOUND &BURN DRESSING) PSTE Apply topically as directed. 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 Apply medihoney to wound bed and cover/pack with calcium alginate silver and proximel dressing. Change daily and PRN if soiled/saturated or removed.   No facility-administered encounter medications on file as of 05/06/2024.    Review of Systems  Constitutional: Negative.   HENT: Negative.    Eyes: Negative.   Respiratory: Negative.    Cardiovascular: Negative.   Gastrointestinal: Negative.   Genitourinary: Negative.   Musculoskeletal:  Positive for gait problem.  Skin:  Positive for wound.  Neurological:  Positive for weakness.   Psychiatric/Behavioral:  Positive for confusion. Negative for dysphoric mood and sleep disturbance. The patient is not nervous/anxious.     Immunization History  Administered Date(s) Administered   INFLUENZA, HIGH DOSE SEASONAL PF 01/29/2017, 02/08/2019, 02/06/2021   Influenza,trivalent, recombinat, inj, PF 01/27/2018   Influenza-Unspecified 03/31/2012, 02/14/2013, 02/15/2015, 02/04/2016, 01/29/2017, 02/20/2020, 02/09/2024   PFIZER Comirnaty(Gray Top)Covid-19 Tri-Sucrose Vaccine 06/11/2019, 01/11/2020, 08/15/2020   PFIZER(Purple Top)SARS-COV-2 Vaccination 05/21/2019   Pfizer Covid-19 Vaccine Bivalent Booster 15yrs & up 02/06/2021   Pfizer(Comirnaty)Fall Seasonal Vaccine 12 years and older 03/05/2022   Pneumococcal Conjugate-13 06/15/2014   Pneumococcal Polysaccharide-23 05/13/2012   Tdap 11/14/2020   Unspecified SARS-COV-2 Vaccination 04/12/2023, 03/30/2024   Zoster Recombinant(Shingrix) 06/26/2017, 09/16/2017, 02/16/2018   Pertinent  Health Maintenance Due  Topic Date Due   Influenza Vaccine  Completed      08/31/2019    7:13 AM 09/17/2019    7:08 PM 10/30/2019   12:57 PM 02/12/2024    2:50 PM 03/09/2024   12:39 PM  Fall Risk  Falls in the past year?    1 1  Was there an injury with Fall?    0  0   Fall Risk Category Calculator    1 1  (RETIRED)  Patient Fall Risk Level Moderate fall risk  Low fall risk  Low fall risk     Patient at Risk for Falls Due to    Impaired balance/gait History of fall(s)  Fall risk Follow up    Falls evaluation completed Falls evaluation completed     Data saved with a previous flowsheet row definition   Functional Status Survey:    Vitals:   05/06/24 0852  BP: (!) 151/71  Pulse: 95  Temp: 97.8 F (36.6 C)  SpO2: 95%  Weight: 185 lb 14.4 oz (84.3 kg)  Height: 6' 1 (1.854 m)   Body mass index is 24.53 kg/m. Physical Exam Vitals reviewed.  Constitutional:      General: He is not in acute distress. HENT:     Head: Normocephalic.      Right Ear: There is impacted cerumen.     Left Ear: There is impacted cerumen.     Nose: Nose normal.     Mouth/Throat:     Mouth: Mucous membranes are moist.  Eyes:     General:        Right eye: No discharge.        Left eye: No discharge.  Cardiovascular:     Rate and Rhythm: Normal rate and regular rhythm.     Pulses: Normal pulses.     Heart sounds: Normal heart sounds.  Pulmonary:     Effort: Pulmonary effort is normal.     Breath sounds: Normal breath sounds.  Abdominal:     General: Bowel sounds are normal. There is no distension.     Palpations: Abdomen is soft.     Tenderness: There is no abdominal tenderness.  Musculoskeletal:     Cervical back: Neck supple.     Right lower leg: No edema.     Left lower leg: No edema.  Skin:    General: Skin is warm.     Capillary Refill: Capillary refill takes less than 2 seconds.  Neurological:     General: No focal deficit present.     Mental Status: He is alert. Mental status is at baseline.     Gait: Gait abnormal.  Psychiatric:        Mood and Affect: Mood normal.     Labs reviewed: Recent Labs    01/10/24 1504 01/18/24 0000 02/16/24 0000  NA 139 143 138  K 4.0 4.3 4.3  CL 105 108 105  CO2 25 30* 23*  GLUCOSE 128*  --   --   BUN 26* 25* 20  CREATININE 0.59* 0.7  --   CALCIUM 8.9 8.8 8.7   Recent Labs    01/10/24 1504 02/16/24 0000  AST 27 18  ALT 20 15  ALKPHOS 82  --   BILITOT 0.8  --   PROT 6.9  --   ALBUMIN 4.0 3.8   Recent Labs    01/10/24 1504 01/18/24 0000 02/16/24 0000  WBC 6.6 5.3 6.1  NEUTROABS 4.0 2,555.00  --   HGB 12.7* 11.7* 12.6*  HCT 38.6* 36* 39*  MCV 97.5  --   --   PLT 198 187  --    No results found for: TSH Lab Results  Component Value Date   HGBA1C 6.4 02/16/2024   Lab Results  Component Value Date   CHOL 177 02/16/2024   HDL 45 02/16/2024   LDLCALC 108 02/16/2024   TRIG 127 02/16/2024    Significant Diagnostic Results in last 30 days:  No results  found.  Assessment/Plan 1. Cerumen debris on tympanic membrane of both ears (Primary) - start debrox 5gtts to both ears BID x 5 days - flush ears when debrox complete   2. Pressure injury of sacral region, stage 4 (HCC) - ongoing  - onset 12/05 - followed by wound - wound culture unremarkable  - completed doxycycline and Prostat x 14 days - 01/08 debridement> Santyl discontinued> Medihoney started   3. Generalized weakness - plans for LTC per social work - no recent falls - ambulates with w/c  4. Essential hypertension - controlled without medication - off lisinopril   5. Chronic diastolic heart failure (HCC) - compensated - not on medication   6. Pacemaker  7. Neuropathy - stable with gabapentin   8. Prediabetes - A1c 6.4 - diet controlled  9. Mild cognitive impairment - last BIMS 15/15 - appropriate today - forgetful at times - needs some cueing to perform ADLs - cont skilled nursing    Family/ staff Communication: plan discussed with patient and nurse  Labs/tests ordered:  none       [1] No Known Allergies  "

## 2024-05-30 ENCOUNTER — Ambulatory Visit: Admission: RE | Admit: 2024-05-30 | Source: Ambulatory Visit

## 2024-05-30 DIAGNOSIS — Z95828 Presence of other vascular implants and grafts: Secondary | ICD-10-CM

## 2024-05-31 ENCOUNTER — Ambulatory Visit: Admission: RE | Admit: 2024-05-31 | Discharge: 2024-05-31 | Attending: Internal Medicine

## 2024-05-31 ENCOUNTER — Ambulatory Visit

## 2024-05-31 ENCOUNTER — Inpatient Hospital Stay
Admission: RE | Admit: 2024-05-31 | Discharge: 2024-05-31 | Disposition: A | Payer: Self-pay | Source: Ambulatory Visit | Attending: Internal Medicine | Admitting: Internal Medicine

## 2024-05-31 ENCOUNTER — Ambulatory Visit
Admission: RE | Admit: 2024-05-31 | Discharge: 2024-05-31 | Disposition: A | Source: Ambulatory Visit | Attending: Family Medicine | Admitting: Family Medicine

## 2024-05-31 DIAGNOSIS — Z993 Dependence on wheelchair: Secondary | ICD-10-CM

## 2024-05-31 DIAGNOSIS — R7303 Prediabetes: Secondary | ICD-10-CM

## 2024-05-31 DIAGNOSIS — I10 Essential (primary) hypertension: Secondary | ICD-10-CM

## 2024-05-31 DIAGNOSIS — N401 Enlarged prostate with lower urinary tract symptoms: Secondary | ICD-10-CM

## 2024-05-31 DIAGNOSIS — C44310 Basal cell carcinoma of skin of unspecified parts of face: Secondary | ICD-10-CM

## 2024-05-31 DIAGNOSIS — I495 Sick sinus syndrome: Secondary | ICD-10-CM

## 2024-05-31 DIAGNOSIS — R911 Solitary pulmonary nodule: Secondary | ICD-10-CM

## 2024-05-31 DIAGNOSIS — Z452 Encounter for adjustment and management of vascular access device: Secondary | ICD-10-CM | POA: Insufficient documentation

## 2024-05-31 DIAGNOSIS — R339 Retention of urine, unspecified: Secondary | ICD-10-CM

## 2024-05-31 DIAGNOSIS — D649 Anemia, unspecified: Secondary | ICD-10-CM

## 2024-05-31 DIAGNOSIS — Z950 Presence of cardiac pacemaker: Secondary | ICD-10-CM

## 2024-05-31 DIAGNOSIS — I5022 Chronic systolic (congestive) heart failure: Secondary | ICD-10-CM

## 2024-05-31 DIAGNOSIS — D419 Neoplasm of uncertain behavior of unspecified urinary organ: Secondary | ICD-10-CM

## 2024-05-31 DIAGNOSIS — Z95828 Presence of other vascular implants and grafts: Secondary | ICD-10-CM | POA: Insufficient documentation

## 2024-05-31 DIAGNOSIS — N182 Chronic kidney disease, stage 2 (mild): Secondary | ICD-10-CM

## 2024-06-01 ENCOUNTER — Encounter: Payer: Self-pay | Admitting: Orthopedic Surgery

## 2024-06-01 ENCOUNTER — Non-Acute Institutional Stay (SKILLED_NURSING_FACILITY): Payer: Self-pay | Admitting: Orthopedic Surgery

## 2024-06-01 DIAGNOSIS — G3184 Mild cognitive impairment, so stated: Secondary | ICD-10-CM

## 2024-06-01 DIAGNOSIS — L89616 Pressure-induced deep tissue damage of right heel: Secondary | ICD-10-CM

## 2024-06-01 DIAGNOSIS — T148XXA Other injury of unspecified body region, initial encounter: Secondary | ICD-10-CM

## 2024-06-01 DIAGNOSIS — L089 Local infection of the skin and subcutaneous tissue, unspecified: Secondary | ICD-10-CM

## 2024-06-01 DIAGNOSIS — Z7189 Other specified counseling: Secondary | ICD-10-CM

## 2024-06-01 NOTE — Progress Notes (Unsigned)
 " Location:  Other [place].  Nursing Home Room Number: [facility] [id number] Place of Service:  SNF (31) Provider:  Greig Cluster, NP  PCP: Alla Amis, MD  Patient Care Team: Alla Amis, MD as PCP - General [place] [facility] Medicine)  Extended Emergency Contact Information Primary Emergency Contact: [facility] Address: [address]          [place], [place] [address] [place] Home Phone: [phone] Mobile Phone: [phone] Relation: Spouse  Code Status:  Full Code Goals of care: Advanced Directive information    04/14/2024    8:56 AM  Advanced Directives  Does Patient Have a Medical Advance Directive? No  Would patient like information on creating a medical advance directive? No - Patient declined     Chief Complaint  Patient presents with   Chronic Wound and Goals of Care.     Chronic Wound and Goals of Care.     HPI:  Pt is a 89 y.o. male seen today for acute visit due to infected wound.   He currently resides on the skilled nursing unit at [facility]. PMH: atherosclerosis, HTN, SSS s/p pacemaker, lung nodule, peripheral neuropathy, basal cell carcinoma, CKD stage II, renal calculi, prediabetes and anemia.   Care plan meeting with family today to discuss goals of care. Around December, 2025 he developed sacral PU requiring wound specialist. He is receiving weekly visits from wound specialist. Past interventions have included weekly wound debridement, Medihoney applications, Santyl, Calcium alginate and doxycycline. Followed by nutrition, remains on protein supplements. Staff has been turning/repositioning every 2-3 hours. He sits on pressure reduction wheelchair cushion and sleep on air mattress. He is able to stand and pivot on to the commode. Overall physical decline and intermittent incontinence have contributed to inability of wound to heal. Recent wound cultures revealed light growth of staph aureus. 02/02 PICC line was placed. He is  currently receiving IV cefepime, IV vancomycin and po Flagyl. At this time wound measures 8.5 x 5.5 x 2.5 with 3 cm tunneling. Wound is being covered with Medihoney, dakins wet to dry gauze and covered with Proximel dressing. Treatment options and risks discussed with family. Concerns for sepsis, osteomyelitis and non healing discussed with family.   He now has new pressure injury to right heel. Bunny boots have been initiated.   Code status discussed with wife and children. They agree to make him DNR.   Past Medical History:  Diagnosis Date   Anemia    Bradycardia    Cancer (HCC)    skin   Dental bridge present    Permanent Bottom Right   Diabetes mellitus without complication (HCC)    diet controlled   Diverticulitis    osis   Heart murmur    Hematuria 01/22/2024   Seen in the ED 9/14 for generalized weakness, greatest in the lower extremities.  Urinalysis revealed large amount of hemoglobin with negative nitrites and negative leukocytes.  H/H 12.7/38.6.  Past history of nephrolithiasis.     History of kidney stones    History of nephrolithiasis 06/30/2019   HOH (hard of hearing)    Hypertension    Kidney stones    Presence of permanent cardiac pacemaker 02/25/2017   [employer] Azure XT DR [id number]   Prostate disorder    Transfusion history    Past Surgical History:  Procedure Laterality Date   CATARACT EXTRACTION W/PHACO Left 08/10/2019   Procedure: CATARACT EXTRACTION PHACO AND INTRAOCULAR LENS PLACEMENT (IOC) LEFT DIABETIC 9.74  01:16.4  12.7% ;  Surgeon: Mittie Gaskin, MD;  Location: [facility];  Service: Ophthalmology;  Laterality: Left;  Diabetic - diet controlled   CATARACT EXTRACTION W/PHACO Right 08/31/2019   Procedure: CATARACT EXTRACTION PHACO AND INTRAOCULAR LENS PLACEMENT (IOC) RIGHT DIABETIC;  Surgeon: Mittie Gaskin, MD;  Location: [facility];  Service: Ophthalmology;  Laterality: Right;  10.94 1:03.4 17.3%   COLONOSCOPY      HEMORROIDECTOMY     HERNIA REPAIR     PACEMAKER INSERTION N/A 02/25/2017   Procedure: INSERTION PACEMAKER;  Surgeon: Ammon Blunt, MD;  Location: [facility];  Service: Cardiovascular;  Laterality: N/A;   penile resection     PILONIDAL CYST EXCISION     PROSTATE SURGERY      Allergies[1]  Outpatient Encounter Medications as of 06/01/2024  Medication Sig   acetaminophen  (TYLENOL ) 325 MG tablet Take 650 mg by mouth 2 (two) times daily.   acetaminophen  (TYLENOL ) 500 MG tablet Take 500 mg by mouth every 6 (six) hours as needed.   cadexomer iodine (IODOSORB) 0.9 % gel Apply 1 Application topically daily.   Calcium Citrate-Vitamin D (CITRACAL PETITES/VITAMIN D) 200-250 MG-UNIT TABS Take 1 tablet by mouth 2 (two) times daily.   ceFEPime (MAXIPIME) IVPB Inject 1 g into the vein 2 (two) times daily.   gabapentin  (NEURONTIN ) 100 MG capsule Take 100 mg by mouth daily. Take 3 tablets by mouth at bedtime.   KETOCONAZOLE, TOPICAL, 1 % SHAM Apply 1 application  topically. Every Mon, Thursday   Magnesium 250 MG TABS Take 1 tablet by mouth daily.   metroNIDAZOLE (FLAGYL) 500 MG tablet Take 500 mg by mouth every 8 (eight) hours.   Multiple Vitamins-Minerals (CENTRUM SILVER ULTRA MENS PO) Take 1 tablet by mouth daily.   ondansetron  (ZOFRAN -ODT) 4 MG disintegrating tablet Take 1 tablet (4 mg total) by mouth every 8 (eight) hours as needed for nausea or vomiting.   psyllium (METAMUCIL) 58.6 % packet Take 1 packet by mouth daily.   saccharomyces boulardii (FLORASTOR) 250 MG capsule Take 250 mg by mouth 2 (two) times daily.   sodium hypochlorite (DAKIN'S 1/4 STRENGTH) 0.125 % SOLN Irrigate with 1 Application as directed 2 (two) times daily.   vancomycin IVPB Inject 1,000 mg into the vein every 12 (twelve) hours.   Wound Dressings (MEDIHONEY WOUND &BURN DRESSING) PSTE Apply topically as directed. 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 Apply medihoney to wound bed and cover/pack with calcium alginate silver and proximel dressing. Change daily and PRN if soiled/ saturated or removed.   Wound Dressings (MEDIHONEY WOUND &BURN DRESSING) PSTE Apply topically as directed. 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 Apply medihoney to wound bed and cover/pack with calcium alginate silver and proximel dressing. Change daily and PRN if soiled/saturated or removed.   No facility-administered encounter medications on file as of 06/01/2024.    Review of Systems  Constitutional:  Negative for fatigue and fever.  HENT:  Negative for sore throat and trouble swallowing.   Respiratory:  Negative for cough and shortness of breath.   Cardiovascular:  Negative for chest pain and leg swelling.  Gastrointestinal:  Negative for abdominal distention and abdominal pain.  Genitourinary:  Negative for dysuria.  Musculoskeletal:  Positive for gait problem.  Skin:  Positive for wound.  Neurological:  Positive for weakness.  Psychiatric/Behavioral:  Positive for confusion. Negative for dysphoric mood and sleep disturbance. The patient is not nervous/anxious.     Immunization History  Administered Date(s) Administered   INFLUENZA, HIGH DOSE SEASONAL PF 01/29/2017, 02/08/2019, 02/06/2021   Influenza,trivalent, recombinat, inj, PF 01/27/2018   Influenza-Unspecified 03/31/2012, 02/14/2013, 02/15/2015, 02/04/2016, 01/29/2017, 02/20/2020, 02/09/2024   PFIZER Comirnaty(Gray Top)Covid-19 Tri-Sucrose Vaccine 06/11/2019, 01/11/2020, 08/15/2020   PFIZER(Purple Top)SARS-COV-2 Vaccination 05/21/2019   Pfizer Covid-19 Vaccine Bivalent Booster 20yrs & up 02/06/2021   Pfizer(Comirnaty)Fall Seasonal Vaccine 12 years and older 03/05/2022   Pneumococcal Conjugate-13 06/15/2014   Pneumococcal Polysaccharide-23  05/13/2012   Tdap 11/14/2020   Unspecified SARS-COV-2 Vaccination 04/12/2023, 03/30/2024   Zoster Recombinant(Shingrix) 06/26/2017, 09/16/2017, 02/16/2018   Pertinent  Health Maintenance Due  Topic Date Due   Influenza Vaccine  Completed      09/17/2019    7:08 PM 10/30/2019   12:57 PM 02/12/2024    2:50 PM 03/09/2024   12:39 PM 05/06/2024    1:30 PM  Fall Risk  Falls in the past year?   1 1 1   Was there an injury with Fall?   0  0  0  Fall Risk Category Calculator   1 1 1   (RETIRED) Patient Fall Risk Level Low fall risk  Low fall risk      Patient at Risk for Falls Due to   Impaired balance/gait History of fall(s) History of fall(s);Impaired balance/gait  Fall risk Follow up   Falls evaluation completed Falls evaluation completed Falls evaluation completed     Data saved with a previous flowsheet row definition   Functional Status Survey:    Vitals:   06/01/24 1144  BP: (!) 153/60  Pulse: 78  Resp: 18  Temp: 97.7 F (36.5 C)  SpO2: 94%  Weight: 181 lb (82.1 kg)  Height: 6' 1 (1.854 m)   Body mass index is 23.88 kg/m. Physical Exam Vitals reviewed.  Constitutional:      General: He is not in acute distress. HENT:     Head: Normocephalic.     Ears:     Comments: HOH Eyes:     General:        Right eye: No discharge.        Left eye: No discharge.  Cardiovascular:     Rate and Rhythm: Normal rate and regular rhythm.     Pulses: Normal pulses.     Heart sounds: Normal heart sounds.  Pulmonary:     Effort: Pulmonary effort is normal.     Breath sounds: Normal breath sounds.  Abdominal:     General: Bowel sounds are normal. There is no distension.     Palpations: Abdomen is soft.     Tenderness: There is no abdominal tenderness.  Musculoskeletal:     Cervical back: Neck supple.     Right lower leg: No edema.     Left lower leg: No edema.  Skin:    General: Skin is warm.     Capillary Refill: Capillary refill takes less than 2 seconds.     Comments: Right  heel and sacrum dressing CDI, no odor.   Neurological:     General: No focal deficit present.     Mental Status: He is alert. Mental status is at baseline.     Motor: Weakness present.     Gait: Gait abnormal.  Psychiatric:        Mood and Affect: Mood normal.     Labs reviewed: Recent Labs  01/10/24 1504 01/18/24 0000 02/16/24 0000  NA 139 143 138  K 4.0 4.3 4.3  CL 105 108 105  CO2 25 30* 23*  GLUCOSE 128*  --   --   BUN 26* 25* 20  CREATININE 0.59* 0.7  --   CALCIUM 8.9 8.8 8.7   Recent Labs    01/10/24 1504 02/16/24 0000  AST 27 18  ALT 20 15  ALKPHOS 82  --   BILITOT 0.8  --   PROT 6.9  --   ALBUMIN 4.0 3.8   Recent Labs    01/10/24 1504 01/18/24 0000 02/16/24 0000  WBC 6.6 5.3 6.1  NEUTROABS 4.0 2,555.00  --   HGB 12.7* 11.7* 12.6*  HCT 38.6* 36* 39*  MCV 97.5  --   --   PLT 198 187  --    No results found for: TSH Lab Results  Component Value Date   HGBA1C 6.4 02/16/2024   Lab Results  Component Value Date   CHOL 177 02/16/2024   HDL 45 02/16/2024   LDLCALC 108 02/16/2024   TRIG 127 02/16/2024    Significant Diagnostic Results in last 30 days:  DG CHEST PORT 1 VIEW Result Date: 05/31/2024 CLINICAL DATA:  [id number] Status post peripherally inserted central catheter (PICC) central line placement [id number]. EXAM: PORTABLE CHEST 1 VIEW COMPARISON:  02/25/2017. FINDINGS: Bilateral lung fields are clear. Bilateral costophrenic angles are clear. Normal cardio-mediastinal silhouette. There is a left sided 2-lead pacemaker. No acute osseous abnormalities. The soft tissues are within normal limits. Right-sided PICC line noted with its tip overlying the upper portion of superior vena cava. No pneumothorax. IMPRESSION: 1. No active disease. 2. Right-sided PICC line noted with its tip overlying the upper portion of superior vena cava. Electronically Signed   By: Ree Molt M.D.   On: 05/31/2024 14:58   US  EKG SITE RITE Result Date: 05/31/2024 If [facility]  image not attached, placement could not be confirmed due to current cardiac rhythm.   Assessment/Plan 1. Infected wound (Primary) - followed by wound - onset 03/2024 - 02/02 PICC line placed - culture grew mild staph auerus - cont IV cefepime and vanc - cont po Flagyl - cont protein supplements to promote wound healing  - cont frequent turning, procedure reduction pads, air mattress - cont dressing changes with Medihoney and Dakins   2. Advanced directives, counseling/discussion - see above - risk for sepsis, osteomyelitis and non healing - family agreeable to DNR code status - briefly discussed goals of care including hospice   3. Pressure injury of deep tissue of right heel - followed by wound - cont bunny boots  4. Mild cognitive impairment - intermittent confusion  - no agitation - CT head noted mild chronic small vessel ischemic disease  - weight stable - needs cueing for ADLs except feeding  - not on medication     Family/ staff Communication: plan discussed with patient, family and nurse  Labs/tests ordered:  none        [1] No Known Allergies  "
